# Patient Record
Sex: Male | Born: 1990 | Race: White | Hispanic: No | Marital: Single | Smoking: Current every day smoker
Health system: Southern US, Community
[De-identification: ages and names within clinical notes are randomized; demographics above are authoritative.]

---
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
## 2011-11-07 ENCOUNTER — Encounter (HOSPITAL_COMMUNITY): Payer: Self-pay | Admitting: *Deleted

## 2011-11-07 ENCOUNTER — Emergency Department (HOSPITAL_COMMUNITY)
Admission: EM | Admit: 2011-11-07 | Discharge: 2011-11-07 | Disposition: A | Discharge status: Home / Self Care | Payer: Self-pay | Attending: Emergency Medicine | Admitting: Emergency Medicine

## 2011-11-07 DIAGNOSIS — H109 Unspecified conjunctivitis: Secondary | ICD-10-CM | POA: Insufficient documentation

## 2011-11-07 MED ORDER — FLUORESCEIN SODIUM 1 MG OP STRP
ORAL_STRIP | OPHTHALMIC | Status: AC
  Filled 2011-11-07: qty 1

## 2011-11-07 MED ORDER — BACITRACIN-POLYMYXIN B 500-10000 UNIT/GM OP OINT
TOPICAL_OINTMENT | Freq: Once | OPHTHALMIC | Status: AC
  Administered 2011-11-07: 20:00:00 via OPHTHALMIC
  Filled 2011-11-07: qty 3.5

## 2011-11-07 MED ORDER — ERYTHROMYCIN 5 MG/GM OP OINT: TOPICAL_OINTMENT | Freq: Once | OPHTHALMIC | Status: DC

## 2011-11-07 NOTE — ED Provider Notes (Signed)
History     CSN: [id number]  Arrival date & time 11/07/11  1907   First MD Initiated Contact with Patient 11/07/11 1932      Chief Complaint  Patient presents with  . Eye Pain    (Consider location/radiation/quality/duration/timing/severity/associated sxs/prior treatment) HPI Comments: Redness and itching in left eye x 2 days.  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  Crusting in the am.  No difficulty with vision.  Otherwise doing well.  Patient is a 21 y.o. male presenting with eye pain. The history is provided by the patient.  Eye Pain This is a new problem. Episode onset: 2 days ago. The problem occurs constantly. The problem has been unchanged. Pertinent negatives include no arthralgias, chest pain, congestion, fatigue, fever, headaches, rash, sore throat or swollen glands. The symptoms are aggravated by nothing. He has tried nothing for the symptoms.    History reviewed. No pertinent past medical history.  History reviewed. No pertinent past surgical history.  History reviewed. No pertinent family history.  History  Substance Use Topics  . Smoking status: Never Smoker   . Smokeless tobacco: Not on file  . Alcohol Use: Yes      Review of Systems  Constitutional: Negative for fever, activity change and fatigue.  HENT: Negative for congestion and sore throat.   Eyes: Positive for pain.  Respiratory: Negative for chest tightness, shortness of breath, wheezing and stridor.   Cardiovascular: Negative for chest pain and leg swelling.  Genitourinary: Negative for dysuria.  Musculoskeletal: Negative for arthralgias.  Skin: Negative for rash.  Neurological: Negative for headaches.  Psychiatric/Behavioral: Negative for behavioral problems.    Allergies  Zithromax  Home Medications  No current outpatient prescriptions on file.  BP 111/74  Pulse 74  Temp(Src) 98.9 F (37.2 C) (Oral)  Resp 18  SpO2 98%  Physical Exam  Constitutional: He  is oriented to person, place, and time. He appears well-developed and well-nourished. No distress.  HENT:  Head: Normocephalic and atraumatic.  Eyes: EOM are normal. Pupils are equal, round, and reactive to light. Left eye exhibits discharge. No scleral icterus.       No abrasion on woods lamp exam.  Injection of conjunctiva.  Neck: Normal range of motion. Neck supple.  Pulmonary/Chest: Effort normal. No respiratory distress.  Abdominal: Soft. He exhibits no distension and no mass. There is no tenderness. There is no rebound and no guarding.  Musculoskeletal: Normal range of motion. He exhibits no edema and no tenderness.  Neurological: He is alert and oriented to person, place, and time. He has normal reflexes. No cranial nerve deficit. He exhibits normal muscle tone. Coordination normal.  Skin: Skin is warm and dry. No rash noted. He is not diaphoretic. No erythema.  Psychiatric: He has a normal mood and affect. His behavior is normal. Judgment and thought content normal.    ED Course  Procedures (including critical care time)  Labs Reviewed - No data to display No results found.   1. Conjunctivitis       MDM  Redness and itching in left eye x 2 days.  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  Crusting in the am.  No difficulty with vision.  Otherwise doing well.  No abrasion on woods lamp exam.  Treating with polytrim for conjunctivitis.  PCP f/u.  Pt comfortable with plan and will follow up.         Army Chaco, MD 11/07/11 2010

## 2011-11-07 NOTE — Discharge Instructions (Signed)
Conjunctivitis Conjunctivitis is commonly called "pink eye." Conjunctivitis can be caused by bacterial or viral infection, allergies, or injuries. There is usually redness of the lining of the eye, itching, discomfort, and sometimes discharge. There may be deposits of matter along the eyelids. A viral infection usually causes a watery discharge, while a bacterial infection causes a yellowish, thick discharge. Pink eye is very contagious and spreads by direct contact. You may be given antibiotic eyedrops as part of your treatment. Before using your eye medicine, remove all drainage from the eye by washing gently with warm water and cotton balls. Continue to use the medication until you have awakened 2 mornings in a row without discharge from the eye. Do not rub your eye. This increases the irritation and helps spread infection. Use separate towels from other household members. Wash your hands with soap and water before and after touching your eyes. Use cold compresses to reduce pain and sunglasses to relieve irritation from light. Do not wear contact lenses or wear eye makeup until the infection is gone. SEEK MEDICAL CARE IF:   Your symptoms are not better after 3 days of treatment.   You have increased pain or trouble seeing.   The outer eyelids become very red or swollen.  Document Released: 07/18/2004 Document Revised: 05/30/2011 Document Reviewed: 06/10/2005 [employer] Patient Information 2012 [employer]. 

## 2011-11-07 NOTE — ED Notes (Signed)
MD at bedside. 

## 2011-11-07 NOTE — ED Provider Notes (Signed)
21 year old male has had itching and redness of his left eye for the last 3 days. The eyelids have been matted shut in the morning. He denies pain. He was worried that he might discuss according to [employer] has been doing Holiday representative work. Exam is typical of conjunctivitis with erythema the conjunctiva with limbal sparing. Anterior chamber is clear. He is advised to use Naphcon-A for itching and reassured of benign nature of his condition.  Dione Booze, MD 11/07/11 [phone]

## 2011-11-07 NOTE — ED Notes (Signed)
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

## 2011-11-07 NOTE — ED Provider Notes (Signed)
I saw and evaluated the patient, reviewed the resident's note and I agree with the findings and plan.   Dione Booze, MD 11/07/11 [phone]

## 2012-06-12 ENCOUNTER — Encounter (HOSPITAL_COMMUNITY): Payer: Self-pay | Admitting: *Deleted

## 2012-06-12 ENCOUNTER — Emergency Department (INDEPENDENT_AMBULATORY_CARE_PROVIDER_SITE_OTHER)
Admission: EM | Admit: 2012-06-12 | Discharge: 2012-06-12 | Disposition: A | Discharge status: Home / Self Care | Payer: Self-pay | Source: Home / Self Care | Attending: Emergency Medicine | Admitting: Emergency Medicine

## 2012-06-12 DIAGNOSIS — B009 Herpesviral infection, unspecified: Secondary | ICD-10-CM

## 2012-06-12 DIAGNOSIS — B001 Herpesviral vesicular dermatitis: Secondary | ICD-10-CM

## 2012-06-12 LAB — POCT RAPID STREP A: Streptococcus, Group A Screen (Direct): NEGATIVE

## 2012-06-12 MED ORDER — PENICILLIN V POTASSIUM 500 MG PO TABS: 500.0000 mg | ORAL_TABLET | Freq: Four times a day (QID) | ORAL | Status: DC

## 2012-06-12 MED ORDER — MAGIC MOUTHWASH W/LIDOCAINE: 5.0000 mL | Freq: Four times a day (QID) | ORAL | Status: DC

## 2012-06-12 MED ORDER — ACYCLOVIR 400 MG PO TABS: 400.0000 mg | ORAL_TABLET | ORAL | Status: DC

## 2012-06-12 MED ORDER — TRAMADOL HCL 50 MG PO TABS: 100.0000 mg | ORAL_TABLET | Freq: Three times a day (TID) | ORAL | Status: DC | PRN

## 2012-06-12 NOTE — ED Provider Notes (Signed)
Chief Complaint  Patient presents with  . Fever    History of Present Illness:   Joseph Chapman is a 21 year old male who has had a three-day history of fever up to 103, multiple blisters on his lips, swelling of the gingiva, and sore throat. His teeth into her shoulder and is painful for him to eat. He's had some nasal congestion and rhinorrhea and some loose stools. He denies any cough. He's not had a prior history of fever blisters or herpes on his lips are in his mouth. He's concerned because he was sexually active with a male sexual partner right before this began. The patient states he did not perform oral sex, his partner did not appear to have any lesions on her lips or in her mouth.  Review of Systems:  Other than noted above, the patient denies any of the following symptoms: Systemic:  No fevers, chills, sweats, weight loss or gain, fatigue, or tiredness. Eye:  No redness, pain, discharge, itching, blurred vision, or diplopia. ENT:  No headache, nasal congestion, sneezing, itching, epistaxis, ear pain, congestion, decreased hearing, ringing in ears, vertigo, or tinnitus.  No oral lesions, sore throat, pain on swallowing, or hoarseness. Neck:  No mass, tenderness or adenopathy. Lungs:  No coughing, wheezing, or shortness of breath. Skin:  No rash or itching.  PMFSH:  Past medical history, family history, social history, meds, and allergies were reviewed.  Physical Exam:   Vital signs:  BP 122/80  Pulse 78  Temp 100.2 F (37.9 C) (Oral)  Resp 16  SpO2 100% General:  Alert and oriented.  In no distress.  Skin warm and dry. Eye:  PERRL, full EOMs, lids and conjunctiva normal.   ENT:  TMs and canals clear.  Nasal mucosa not congested and without drainage.  Mucous membranes moist, he has multiple, pustular blisters on his upper and lower lips. His gingiva is swollen, red, and tender to touch. There no intraoral lesions. His tonsils are enlarged and red and there are a few spots of white  exudate.  No cranial or facial pain to palplation. Neck:  Supple, full ROM.  No adenopathy, tenderness or mass.  Thyroid normal. Lungs:  Breath sounds clear and equal bilaterally.  No wheezes, rales or rhonchi. Heart:  Rhythm regular, without extrasystoles.  No gallops or murmers. Skin:  Clear, warm and dry.  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  Plan:   1.  The following meds were prescribed:   New Prescriptions   ACYCLOVIR (ZOVIRAX) 400 MG TABLET    Take 1 tablet (400 mg total) by mouth every 4 (four) hours while awake.   ALUM & MAG HYDROXIDE-SIMETH (MAGIC MOUTHWASH W/LIDOCAINE) SOLN    Take 5 mLs by mouth 4 (four) times daily.   PENICILLIN V POTASSIUM (VEETID) 500 MG TABLET    Take 1 tablet (500 mg total) by mouth 4 (four) times daily.   TRAMADOL (ULTRAM) 50 MG TABLET    Take 2 tablets (100 mg total) by mouth every 8 (eight) hours as needed for pain.   2.  The patient was instructed in symptomatic care and handouts were given. 3.  The patient was told to return if becoming worse in any way, if no better  in 3 or 4 days, and given some red flag symptoms that would indicate earlier return.  Follow up:  The patient was told to follow up here if no better in 3-4 days.       Reuben Likes, MD 06/12/12 [phone]

## 2012-06-12 NOTE — ED Notes (Signed)
Pt  Reports  Symptoms  Of  Fever          Blisters     On  Mouth              X  2  Days     Pt   Reports    He  Ho  Genitalia  Symptoms              He  Reports  He  Has  Tried  otc  meds   For the  Sores

## 2013-03-07 ENCOUNTER — Emergency Department (HOSPITAL_COMMUNITY)
Admission: EM | Admit: 2013-03-07 | Discharge: 2013-03-07 | Disposition: A | Discharge status: Home / Self Care | Payer: Self-pay | Attending: Emergency Medicine | Admitting: Emergency Medicine

## 2013-03-07 ENCOUNTER — Encounter (HOSPITAL_COMMUNITY): Payer: Self-pay | Admitting: Nurse Practitioner

## 2013-03-07 DIAGNOSIS — IMO0002 Reserved for concepts with insufficient information to code with codable children: Secondary | ICD-10-CM | POA: Insufficient documentation

## 2013-03-07 DIAGNOSIS — Z792 Long term (current) use of antibiotics: Secondary | ICD-10-CM | POA: Insufficient documentation

## 2013-03-07 DIAGNOSIS — Z79899 Other long term (current) drug therapy: Secondary | ICD-10-CM | POA: Insufficient documentation

## 2013-03-07 DIAGNOSIS — L0291 Cutaneous abscess, unspecified: Secondary | ICD-10-CM

## 2013-03-07 MED ORDER — CEPHALEXIN 500 MG PO CAPS: 500.0000 mg | ORAL_CAPSULE | Freq: Four times a day (QID) | ORAL | Status: DC

## 2013-03-07 NOTE — ED Notes (Signed)
Pt with 2 red painful spots to R elbow x 2 days. One of the bumps had white area so he popped it and drainage came out but it continues to get more painful and swollen. Took aspirin with some relief

## 2013-03-07 NOTE — ED Provider Notes (Signed)
CSN: [id number]     Arrival date & time 03/07/13  1447 History  This chart was scribed for non-physician practitioner Junious Silk, PA-C, working with Audree Camel, MD by Dorothey Baseman, ED Scribe. This patient was seen in room TR09C/TR09C and the patient's care was started at 3:35 PM.    Chief Complaint  Patient presents with  . Wound Infection   The history is provided by the patient. No language interpreter was used.   HPI Comments: Joseph Chapman is a 22 y.o. male who presents to the Emergency Department complaining of an abscess to his right elbow onset 1-2 days ago with an associated burning pain, swelling, warmth, and drainage. He states that he may have been bit by a spider at his job, but is unsure. He states that he applied A&D ointment to the area and has taken Aspirin at home with mild, temporary relief. He states these symptoms are new for him. No prior history of abscess. He denies fever, nausea, vomiting, headaches, or any other symptoms at this time.   History reviewed. No pertinent past medical history. History reviewed. No pertinent past surgical history. History reviewed. No pertinent family history. History  Substance Use Topics  . Smoking status: Never Smoker   . Smokeless tobacco: Not on file  . Alcohol Use: Yes    Review of Systems  A complete 10 system review of systems was obtained and all systems are negative except as noted in the HPI and PMH.   Allergies  Zithromax  Home Medications   Current Outpatient Rx  Name  Route  Sig  Dispense  Refill  . acyclovir (ZOVIRAX) 400 MG tablet   Oral   Take 1 tablet (400 mg total) by mouth every 4 (four) hours while awake.   50 tablet   0   . Alum & Mag Hydroxide-Simeth (MAGIC MOUTHWASH W/LIDOCAINE) SOLN   Oral   Take 5 mLs by mouth 4 (four) times daily.   240 mL   0   . penicillin v potassium (VEETID) 500 MG tablet   Oral   Take 1 tablet (500 mg total) by mouth 4 (four) times daily.   40 tablet   0    . traMADol (ULTRAM) 50 MG tablet   Oral   Take 2 tablets (100 mg total) by mouth every 8 (eight) hours as needed for pain.   30 tablet   0    Triage Vitals: BP 149/73  Pulse 82  Temp(Src) 97.8 F (36.6 C) (Oral)  Resp 16  SpO2 99%  Physical Exam  Nursing note and vitals reviewed. Constitutional: He is oriented to person, place, and time. He appears well-developed and well-nourished. No distress.  HENT:  Head: Normocephalic and atraumatic.  Right Ear: External ear normal.  Left Ear: External ear normal.  Nose: Nose normal.  Eyes: Conjunctivae are normal.  Neck: Normal range of motion. Neck supple. No tracheal deviation present.  Cardiovascular: Normal rate, regular rhythm and normal heart sounds.   Pulmonary/Chest: Effort normal and breath sounds normal. No stridor.  Abdominal: Soft. He exhibits no distension. There is no tenderness.  Musculoskeletal: Normal range of motion.  Neurological: He is alert and oriented to person, place, and time.  Skin: Skin is warm and dry. He is not diaphoretic.  3cm area of erythema and induration with a small pustule, just superior to the right elbow. 2cm area of induration just inferior to the right elbow.  Psychiatric: He has a normal mood and affect.  His behavior is normal.    ED Course  Procedures (including critical care time)  DIAGNOSTIC STUDIES: Oxygen Saturation is 99% on room air, normal by my interpretation.    COORDINATION OF CARE: 3:38PM- Will perform an incision and drainage of the area. Discussed treatment plan with patient at bedside and patient verbalized agreement.   INCISION AND DRAINAGE Performed by: Junious Silk Consent: Verbal consent obtained. Risks and benefits: risks, benefits and alternatives were discussed Type: abscess  Body area: right distal upper arm  Anesthesia: local infiltration  Incision was made with a scalpel.  Local anesthetic: lidocaine 2% 1% epinephrine  Anesthetic total: 3  ml  Complexity: complex Blunt dissection to break up loculations  Drainage: purulent  Drainage amount: mild  Patient tolerance: Patient tolerated the procedure well with no immediate complications.  INCISION AND DRAINAGE Performed by: Junious Silk Consent: Verbal consent obtained. Risks and benefits: risks, benefits and alternatives were discussed Type: abscess  Body area: right proximal lower arm  Anesthesia: local infiltration  Incision was made with a scalpel.  Local anesthetic: lidocaine 2% 1% epinephrine  Anesthetic total: 3 ml  Complexity: complex Blunt dissection to break up loculations  Drainage: purulent  Drainage amount: mild  Patient tolerance: Patient tolerated the procedure well with no immediate complications.        Labs Review Labs Reviewed - No data to display Imaging Review No results found.  MDM   1. Abscess    Patient with skin abscess amenable to incision and drainage.  Abscess was not large enough to warrant packing or drain,  wound recheck in 2 days. Encouraged home warm soaks and flushing.  Signs of cellulitis is surrounding skin.  Will d/c to home. Covered with Keflex.   I personally performed the services described in this documentation, which was scribed in my presence. The recorded information has been reviewed and is accurate.     Mora Bellman, PA-C 03/07/13 [phone]

## 2013-03-08 NOTE — ED Provider Notes (Signed)
Medical screening examination/treatment/procedure(s) were performed by non-physician practitioner and as supervising physician I was immediately available for consultation/collaboration.   Audree Camel, MD 03/08/13 1224

## 2013-10-13 ENCOUNTER — Emergency Department (HOSPITAL_COMMUNITY)
Admission: EM | Admit: 2013-10-13 | Discharge: 2013-10-13 | Disposition: A | Discharge status: Home / Self Care | Payer: Self-pay | Attending: Emergency Medicine | Admitting: Emergency Medicine

## 2013-10-13 ENCOUNTER — Encounter (HOSPITAL_COMMUNITY): Payer: Self-pay | Admitting: Emergency Medicine

## 2013-10-13 DIAGNOSIS — H00013 Hordeolum externum right eye, unspecified eyelid: Secondary | ICD-10-CM

## 2013-10-13 DIAGNOSIS — Z7982 Long term (current) use of aspirin: Secondary | ICD-10-CM | POA: Insufficient documentation

## 2013-10-13 DIAGNOSIS — H00019 Hordeolum externum unspecified eye, unspecified eyelid: Secondary | ICD-10-CM | POA: Insufficient documentation

## 2013-10-13 MED ORDER — BACITRACIN-POLYMYXIN B 500-10000 UNIT/GM OP OINT
1.0000 | TOPICAL_OINTMENT | Freq: Two times a day (BID) | OPHTHALMIC | Status: DC
Start: 2013-10-13 — End: 2015-08-18

## 2013-10-13 NOTE — Discharge Instructions (Signed)
Use antibiotic ointment until symptoms resolve. Refer to attached documents for more information.

## 2013-10-13 NOTE — ED Notes (Addendum)
Pt c/o painful, swelling lesions to right eyelid x 1 week. Pt very anxious.

## 2013-10-13 NOTE — ED Provider Notes (Signed)
CSN: [id number]     Arrival date & time 10/13/13  1235 History  This chart was scribed for non-physician practitioner working with Emilia BeckKaitlyn Fernando Stoiber , by Tana ConchStephen Methvin ED Scribe. This patient was seen in TR04C/TR04C and the patient's care was started at 2:23 PM.    Chief Complaint  Patient presents with  . Eye Problem      Patient is a 23 y.o. male presenting with eye problem. The history is provided by the patient. No language interpreter was used.  Eye Problem Location:  R eye Quality:  Unable to specify Severity:  Moderate Onset quality:  Gradual Duration:  1 week Timing:  Sporadic Progression:  Worsening Chronicity:  Recurrent Relieved by:  Nothing Worsened by:  Nothing tried Ineffective treatments:  None tried Associated symptoms: blurred vision and redness     HPI Comments: Asencion PartridgeLawrence J Dicenso is a 23 y.o. male who presents to the Emergency Department complaining of reccurent right eye pain, he says "there are several sores" He has been putting seeing a physician off as he cannot afford one currently. He also states that his vision in that eye is blurry.   History reviewed. No pertinent past medical history. History reviewed. No pertinent past surgical history. No family history on file. History  Substance Use Topics  . Smoking status: Never Smoker   . Smokeless tobacco: Not on file  . Alcohol Use: No    Review of Systems  Eyes: Positive for blurred vision, pain and redness.       Swelling around right eye   All other systems reviewed and are negative.     Allergies  Zithromax  Home Medications   Prior to Admission medications   Medication Sig Start Date End Date Taking? Authorizing Provider  acetaminophen (TYLENOL) 325 MG tablet Take 650 mg by mouth every 6 (six) hours as needed for mild pain, moderate pain or fever.   Yes Historical Provider, MD  aspirin 325 MG tablet Take 325 mg by mouth every 4 (four) hours as needed.   Yes Historical Provider, MD    BP 148/97  Pulse 70  Temp(Src) 98.1 F (36.7 C) (Oral)  SpO2 100% Physical Exam  Nursing note and vitals reviewed. Constitutional: He is oriented to person, place, and time. He appears well-developed and well-nourished.  HENT:  Head: Normocephalic.  Eyes: EOM are normal.  Right eyelid  Upper inner eyelid tender, palpable mass. No drainage. Erythema to affected area.  Neck: Normal range of motion.  Pulmonary/Chest: Effort normal.  Abdominal: He exhibits no distension.  Musculoskeletal: Normal range of motion.  Neurological: He is alert and oriented to person, place, and time.  Psychiatric: He has a normal mood and affect.    ED Course  Procedures (including critical care time)  DIAGNOSTIC STUDIES: Oxygen Saturation is 100% on RA, normal by my interpretation.    COORDINATION OF CARE:   2:25 PM-Discussed treatment plan which includes polysporin with pt at bedside and pt agreed to plan.   Labs Review Labs Reviewed - No data to display  Imaging Review No results found.   EKG Interpretation None      MDM   Final diagnoses:  Hordeolum externum of right eye   I personally performed the services described in this documentation, which was scribed in my presence. The recorded information has been reviewed and is accurate.   2:45 PM Patient has a hordeolum of his right eye. Patient will be discharged with polysporin ointment.     Emilia BeckKaitlyn Gates Jividen,  PA-C 10/13/13 1446

## 2013-10-13 NOTE — ED Provider Notes (Signed)
Medical screening examination/treatment/procedure(s) were performed by non-physician practitioner and as supervising physician I was immediately available for consultation/collaboration.   EKG Interpretation None        Dior Stepter M Tonantzin Mimnaugh, DO 10/13/13 1918 

## 2014-10-23 ENCOUNTER — Emergency Department (HOSPITAL_COMMUNITY): Payer: 59

## 2014-10-23 ENCOUNTER — Encounter (HOSPITAL_COMMUNITY): Payer: Self-pay | Admitting: Emergency Medicine

## 2014-10-23 ENCOUNTER — Emergency Department (HOSPITAL_COMMUNITY)
Admission: EM | Admit: 2014-10-23 | Discharge: 2014-10-24 | Disposition: A | Discharge status: Home / Self Care | Payer: 59 | Attending: Emergency Medicine | Admitting: Emergency Medicine

## 2014-10-23 DIAGNOSIS — Y929 Unspecified place or not applicable: Secondary | ICD-10-CM | POA: Insufficient documentation

## 2014-10-23 DIAGNOSIS — R1110 Vomiting, unspecified: Secondary | ICD-10-CM | POA: Diagnosis not present

## 2014-10-23 DIAGNOSIS — R109 Unspecified abdominal pain: Secondary | ICD-10-CM | POA: Diagnosis not present

## 2014-10-23 DIAGNOSIS — Z7982 Long term (current) use of aspirin: Secondary | ICD-10-CM | POA: Diagnosis not present

## 2014-10-23 DIAGNOSIS — L03113 Cellulitis of right upper limb: Secondary | ICD-10-CM | POA: Insufficient documentation

## 2014-10-23 DIAGNOSIS — Z87891 Personal history of nicotine dependence: Secondary | ICD-10-CM | POA: Diagnosis not present

## 2014-10-23 DIAGNOSIS — Y939 Activity, unspecified: Secondary | ICD-10-CM | POA: Diagnosis not present

## 2014-10-23 DIAGNOSIS — R51 Headache: Secondary | ICD-10-CM | POA: Diagnosis not present

## 2014-10-23 DIAGNOSIS — W57XXXA Bitten or stung by nonvenomous insect and other nonvenomous arthropods, initial encounter: Secondary | ICD-10-CM | POA: Insufficient documentation

## 2014-10-23 DIAGNOSIS — Y999 Unspecified external cause status: Secondary | ICD-10-CM | POA: Diagnosis not present

## 2014-10-23 DIAGNOSIS — S60561A Insect bite (nonvenomous) of right hand, initial encounter: Secondary | ICD-10-CM | POA: Diagnosis present

## 2014-10-23 LAB — CBC WITH DIFFERENTIAL/PLATELET
BASOS ABS: 0 10*3/uL (ref 0.0–0.1)
BASOS PCT: 0 % (ref 0–1)
Eosinophils Absolute: 0.2 10*3/uL (ref 0.0–0.7)
Eosinophils Relative: 1 % (ref 0–5)
HEMATOCRIT: 41.6 % (ref 39.0–52.0)
HEMOGLOBIN: 14.6 g/dL (ref 13.0–17.0)
LYMPHS PCT: 22 % (ref 12–46)
Lymphs Abs: 2.8 10*3/uL (ref 0.7–4.0)
MCH: 30.7 pg (ref 26.0–34.0)
MCHC: 35.1 g/dL (ref 30.0–36.0)
MCV: 87.6 fL (ref 78.0–100.0)
Monocytes Absolute: 0.8 10*3/uL (ref 0.1–1.0)
Monocytes Relative: 6 % (ref 3–12)
NEUTROS ABS: 9.2 10*3/uL — AB (ref 1.7–7.7)
NEUTROS PCT: 71 % (ref 43–77)
Platelets: 209 10*3/uL (ref 150–400)
RBC: 4.75 MIL/uL (ref 4.22–5.81)
RDW: 12.3 % (ref 11.5–15.5)
WBC: 13 10*3/uL — AB (ref 4.0–10.5)

## 2014-10-23 LAB — I-STAT CHEM 8, ED
BUN: 9 mg/dL (ref 6–20)
CALCIUM ION: 1.14 mmol/L (ref 1.12–1.23)
CHLORIDE: 99 mmol/L — AB (ref 101–111)
Creatinine, Ser: 0.9 mg/dL (ref 0.61–1.24)
GLUCOSE: 94 mg/dL (ref 70–99)
HCT: 45 % (ref 39.0–52.0)
Hemoglobin: 15.3 g/dL (ref 13.0–17.0)
Potassium: 3.6 mmol/L (ref 3.5–5.1)
Sodium: 139 mmol/L (ref 135–145)
TCO2: 23 mmol/L (ref 0–100)

## 2014-10-23 MED ORDER — OXYCODONE-ACETAMINOPHEN 5-325 MG PO TABS
2.0000 | ORAL_TABLET | Freq: Once | ORAL | Status: AC
  Administered 2014-10-23: 2 via ORAL
  Filled 2014-10-23: qty 2

## 2014-10-23 MED ORDER — DOXYCYCLINE HYCLATE 100 MG PO TABS
100.0000 mg | ORAL_TABLET | Freq: Once | ORAL | Status: AC
  Administered 2014-10-23: 100 mg via ORAL
  Filled 2014-10-23: qty 1

## 2014-10-23 NOTE — ED Provider Notes (Signed)
CSN: [id number]     Arrival date & time 10/23/14  2135 History  This chart was scribed for Zadie Rhine, MD by Annye Asa, ED Scribe. This patient was seen in room D30C/D30C and the patient's care was started at 11:08 PM.   Patient gave verbal permission to utilize photo for medical documentation only The image was not stored on any personal device   Chief Complaint  Patient presents with  . Insect Bite   Patient is a 24 y.o. male presenting with hand pain. The history is provided by the patient. No language interpreter was used.  Hand Pain This is a new problem. The current episode started 2 days ago. The problem occurs constantly. The problem has been gradually worsening. Associated symptoms include abdominal pain and headaches. Exacerbated by: Movement of the hand, applied pressure. Nothing relieves the symptoms. Treatments tried: ibuprofen. The treatment provided no relief.    HPI Comments: Joseph Chapman is an otherwise healthy 24 y.o. male who presents to the Emergency Department complaining of 2 days of gradually worsening right hand pain after what the patient believes is an insect bite. Patient reports swelling and redness to the area, abdominal pain, vomiting (1x), generalized myalgias, headache. He explains he has difficulty opening and closing his right hand due to pain. Patient states he has taken ibuprofen without relief. He denies known injury or trauma. He denies fever, cough, known tick exposure.   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   PMH - none  History  Substance Use Topics  . Smoking status: Former Games developer  . Smokeless tobacco: Not on file  . Alcohol Use: No    Review of Systems  Constitutional: Negative for fever.  Respiratory: Negative for cough.   Gastrointestinal: Positive for vomiting and abdominal pain.  Musculoskeletal: Positive for myalgias.  Skin:       Redness and swelling to the right hand    Neurological: Positive for headaches.  All other systems reviewed and are negative.   Allergies  Zithromax  Home Medications   Prior to Admission medications   Medication Sig Start Date End Date Taking? Authorizing Provider  acetaminophen (TYLENOL) 325 MG tablet Take 650 mg by mouth every 6 (six) hours as needed for mild pain, moderate pain or fever.    Historical Provider, MD  aspirin 325 MG tablet Take 325 mg by mouth every 4 (four) hours as needed.    Historical Provider, MD  bacitracin-polymyxin b (POLYSPORIN) ophthalmic ointment Place 1 application into the right eye every 12 (twelve) hours. apply to eye every 12 hours while awake 10/13/13   Kaitlyn Szekalski, PA-C   BP 136/95 mmHg  Pulse 80  Temp(Src) 98.1 F (36.7 C) (Oral)  Resp 18  Ht  (1.626 m)  Wt 154 lb 1.6 oz (69.899 kg)  BMI 26.44 kg/m2  SpO2 100% Physical Exam  Nursing note and vitals reviewed.   CONSTITUTIONAL: Well developed/well nourished HEAD: Normocephalic/atraumatic EYES: EOMI ENMT: Mucous membranes moist NECK: supple no meningeal signs SPINE/BACK:entire spine nontender CV: S1/S2 noted, no murmurs/rubs/gallops noted LUNGS: Lungs are clear to auscultation bilaterally, no apparent distress ABDOMEN: soft, nontender, no rebound or guarding, bowel sounds noted throughout abdomen NEURO: Pt is awake/alert/appropriate, moves all extremitiesx4.  No facial droop.   EXTREMITIES: pulses normal/equal, full ROM SKIN: warm, color normal PSYCH: no abnormalities of mood noted, alert and oriented to situation    ED Course  Procedures   DIAGNOSTIC STUDIES: Oxygen  Saturation is 100% on RA, normal by my interpretation.    COORDINATION OF CARE: 11:14 PM Discussed treatment plan with pt at bedside and pt agreed to plan.  No signs of trauma No fluctuance/crepitus noted Will treat as cellulitis Discussed strict return precautions He was advised to f/u in 48 HRS for recheck   Medications   oxyCODONE-acetaminophen (PERCOCET/ROXICET) 5-325 MG per tablet 2 tablet (2 tablets Oral Given 10/23/14 2317)  doxycycline (VIBRA-TABS) tablet 100 mg (100 mg Oral Given 10/23/14 2317)     Labs Review Labs Reviewed  CBC WITH DIFFERENTIAL/PLATELET - Abnormal; Notable for the following:    WBC 13.0 (*)    Neutro Abs 9.2 (*)    All other components within normal limits  I-STAT CHEM 8, ED - Abnormal; Notable for the following:    Chloride 99 (*)    All other components within normal limits    Imaging Review Dg Hand Complete Right  10/23/2014   CLINICAL DATA:  Spider bite the right hand, fifth metacarpal  EXAM: RIGHT HAND - COMPLETE 3+ VIEW  COMPARISON:  None.  FINDINGS: No evidence of fracture of the carpal or metacarpal bones. Radiocarpal joint is intact. Phalanges are normal. No soft tissue injury. No foreign body  IMPRESSION: No fracture or foreign body.   Electronically Signed   By: Genevive BiStewart  Edmunds M.D.   On: 10/23/2014 23:56     MDM   Final diagnoses:  None    Nursing notes including past medical history and social history reviewed and considered in documentation xrays/imaging reviewed by myself and considered during evaluation Labs/vital reviewed myself and considered during evaluation    I personally performed the services described in this documentation, which was scribed in my presence. The recorded information has been reviewed and is accurate.      Zadie Rhineonald Kurk Corniel, MD 10/24/14 Moses [email or web address]

## 2014-10-23 NOTE — ED Notes (Addendum)
Pt c/o possible spider bite to right hand.  Right hand swollen and red. Pt c/o aching all over and headache.  Also has vomited x's 1 today

## 2014-10-23 NOTE — ED Notes (Signed)
MD at bedside. 

## 2014-10-23 NOTE — ED Notes (Signed)
Taken to xray at this time. 

## 2014-10-24 MED ORDER — DOXYCYCLINE HYCLATE 100 MG PO CAPS: 100.0000 mg | ORAL_CAPSULE | Freq: Two times a day (BID) | ORAL | Status: DC

## 2014-10-24 MED ORDER — IBUPROFEN 600 MG PO TABS: 600.0000 mg | ORAL_TABLET | Freq: Four times a day (QID) | ORAL | Status: DC | PRN

## 2014-10-24 NOTE — Discharge Instructions (Signed)

## 2015-03-03 ENCOUNTER — Encounter (HOSPITAL_COMMUNITY): Payer: Self-pay | Admitting: Emergency Medicine

## 2015-03-03 ENCOUNTER — Emergency Department (HOSPITAL_COMMUNITY)
Admission: EM | Admit: 2015-03-03 | Discharge: 2015-03-04 | Disposition: A | Discharge status: Home / Self Care | Payer: 59 | Attending: Emergency Medicine | Admitting: Emergency Medicine

## 2015-03-03 DIAGNOSIS — Z23 Encounter for immunization: Secondary | ICD-10-CM | POA: Insufficient documentation

## 2015-03-03 DIAGNOSIS — R110 Nausea: Secondary | ICD-10-CM | POA: Insufficient documentation

## 2015-03-03 DIAGNOSIS — Z87891 Personal history of nicotine dependence: Secondary | ICD-10-CM | POA: Insufficient documentation

## 2015-03-03 DIAGNOSIS — L03113 Cellulitis of right upper limb: Secondary | ICD-10-CM

## 2015-03-03 DIAGNOSIS — L02511 Cutaneous abscess of right hand: Secondary | ICD-10-CM

## 2015-03-03 DIAGNOSIS — Z7982 Long term (current) use of aspirin: Secondary | ICD-10-CM | POA: Insufficient documentation

## 2015-03-03 MED ORDER — OXYCODONE-ACETAMINOPHEN 5-325 MG PO TABS
1.0000 | ORAL_TABLET | Freq: Once | ORAL | Status: AC
  Administered 2015-03-03: 1 via ORAL
  Filled 2015-03-03: qty 1

## 2015-03-03 NOTE — ED Provider Notes (Signed)
CSN: [id number]     Arrival date & time 03/03/15  2244 History  This chart was scribed for non-physician practitioner, Trixie Dredge, PA working with Derwood Kaplan, MD by Gwenyth Ober, ED scribe. This patient was seen in room TR06C/TR06C and the patient's care was started at 11:33 PM   Chief Complaint  Patient presents with  . Abscess   The history is provided by the patient. No language interpreter was used.   HPI Comments: Joseph Chapman is a 24 y.o. male who presents to the Emergency Department complaining of constant, moderate, aching right hand pain that started 1 week ago. Pt states nausea as an associated symptom. His pain becomes worse with movement and palpation of his hand. He has tried topical antibiotic ointment and peroxide with no relief. Pt reports that onset of pain started after he was sleeping and he felt something bite his hand. He did not see any animals. Pt works as a Nutritional therapist. He denies fever, chills and vomiting.   History reviewed. No pertinent past medical history. History reviewed. No pertinent past surgical history. No family history on file. Social History  Substance Use Topics  . Smoking status: Former Games developer  . Smokeless tobacco: None  . Alcohol Use: No    Review of Systems  Constitutional: Negative for fever and chills.  Respiratory: Negative for shortness of breath.   Gastrointestinal: Positive for nausea. Negative for vomiting and abdominal pain.  Musculoskeletal: Positive for joint swelling and arthralgias.  Skin: Positive for color change and wound.  Allergic/Immunologic: Negative for immunocompromised state.  Hematological: Does not bruise/bleed easily.  Psychiatric/Behavioral: Negative for self-injury.      Allergies  Zithromax  Home Medications   Prior to Admission medications   Medication Sig Start Date End Date Taking? Authorizing Provider  acetaminophen (TYLENOL) 325 MG tablet Take 650 mg by mouth every 6 (six) hours as needed for  mild pain, moderate pain or fever.    Historical Provider, MD  aspirin 325 MG tablet Take 325 mg by mouth every 4 (four) hours as needed.    Historical Provider, MD  bacitracin-polymyxin b (POLYSPORIN) ophthalmic ointment Place 1 application into the right eye every 12 (twelve) hours. apply to eye every 12 hours while awake 10/13/13   Emilia Beck, PA-C  doxycycline (VIBRAMYCIN) 100 MG capsule Take 1 capsule (100 mg total) by mouth 2 (two) times daily. One po bid x 7 days 10/24/14   Zadie Rhine, MD  ibuprofen (ADVIL,MOTRIN) 600 MG tablet Take 1 tablet (600 mg total) by mouth every 6 (six) hours as needed. 10/24/14   Zadie Rhine, MD   BP 130/85 mmHg  Pulse 89  Temp(Src) 98.5 F (36.9 C) (Oral)  Resp 16  SpO2 100% Physical Exam  Constitutional: He appears well-developed and well-nourished. No distress.  HENT:  Head: Normocephalic and atraumatic.  Neck: Neck supple.  Pulmonary/Chest: Effort normal.  Musculoskeletal:  Large area of erythema and edema over the dorsal aspect of the 5th proximal phalanx Scabby pustular head overlying  Erythematous streaking over the dorsal hand Pt has full flexion, but decreased extension due to edema and pain  Neurological: He is alert.  Skin: He is not diaphoretic.  Nursing note and vitals reviewed.      ED Course  Procedures   DIAGNOSTIC STUDIES: Oxygen Saturation is 100% on RA, normal by my interpretation.    COORDINATION OF CARE: 11:41 PM Discussed treatment plan with pt at bedside and pt agreed to plan.   Labs Review Labs  Reviewed  CULTURE, ROUTINE-ABSCESS    Imaging Review No results found.   EKG Interpretation None       12:13 AM Discussed pt with Dr Rhunette Croft who has also seen and examined the patient.  He recommends called Dr Izora Ribas (Hand surgery) for further recommendations.    12:44 AM Discussed pt with Dr Izora Ribas who would like for me to I&D abscess in ED, pack it, and he will see pt in follow up in the office.     INCISION AND DRAINAGE Performed by: Trixie Dredge Consent: Verbal consent obtained. Risks and benefits: risks, benefits and alternatives were discussed Type: abscess  Body area: right dorsal 5th finger  Anesthesia: local infiltration  Incision was made with a scalpel.  Local anesthetic: lidocaine 1% with epinephrine  Anesthetic total: 5 ml  Complexity: complex Blunt dissection to break up loculations  Drainage: purulent  Drainage amount: small  Packing material: none.  Attempted packing but incision and pocket too small.    Abscess irrigated with Normal Saline.   Patient tolerance: Patient tolerated the procedure well with no immediate complications.     MDM   Final diagnoses:  Abscess of finger of right hand  Cellulitis of right hand    Afebrile, nontoxic patient with abscess to dorsal 5th finger with streaking into dorsal hand.  See discussion.  Minimal I&D performed by me.  Abscess culture obtained.  Pt given first dose of bactrim and keflex in ED, after culture obtained.  D/C home with norco, keflex, bactrim, hand surgery follow up.   Discussed result, findings, treatment, and follow up  with patient.  Pt given return precautions.  Pt verbalizes understanding and agrees with plan.       I personally performed the services described in this documentation, which was scribed in my presence. The recorded information has been reviewed and is accurate.    Villa Pancho, PA-C 03/04/15 0133  Derwood Kaplan, MD 03/06/15 [phone]

## 2015-03-03 NOTE — ED Notes (Signed)
Pt. presents with abscess at right proximal 5th finger onset this week with no drainage .

## 2015-03-04 MED ORDER — SULFAMETHOXAZOLE-TRIMETHOPRIM 800-160 MG PO TABS
1.0000 | ORAL_TABLET | Freq: Once | ORAL | Status: AC
  Administered 2015-03-04: 1 via ORAL
  Filled 2015-03-04: qty 1

## 2015-03-04 MED ORDER — IBUPROFEN 400 MG PO TABS
800.0000 mg | ORAL_TABLET | Freq: Once | ORAL | Status: AC
  Administered 2015-03-04: 800 mg via ORAL
  Filled 2015-03-04: qty 4

## 2015-03-04 MED ORDER — IBUPROFEN 800 MG PO TABS: 800.0000 mg | ORAL_TABLET | Freq: Three times a day (TID) | ORAL | Status: DC | PRN

## 2015-03-04 MED ORDER — CEPHALEXIN 500 MG PO CAPS: 500.0000 mg | ORAL_CAPSULE | Freq: Four times a day (QID) | ORAL | Status: DC

## 2015-03-04 MED ORDER — HYDROCODONE-ACETAMINOPHEN 5-325 MG PO TABS
1.0000 | ORAL_TABLET | ORAL | Status: DC | PRN
Start: 2015-03-04 — End: 2015-08-18

## 2015-03-04 MED ORDER — LIDOCAINE HCL (PF) 1 % IJ SOLN
5.0000 mL | Freq: Once | INTRAMUSCULAR | Status: AC
  Administered 2015-03-04: 5 mL
  Filled 2015-03-04: qty 5

## 2015-03-04 MED ORDER — SULFAMETHOXAZOLE-TRIMETHOPRIM 800-160 MG PO TABS: 1.0000 | ORAL_TABLET | Freq: Two times a day (BID) | ORAL | Status: AC

## 2015-03-04 MED ORDER — CEPHALEXIN 250 MG PO CAPS
500.0000 mg | ORAL_CAPSULE | Freq: Once | ORAL | Status: AC
  Administered 2015-03-04: 500 mg via ORAL
  Filled 2015-03-04: qty 2

## 2015-03-04 MED ORDER — TETANUS-DIPHTH-ACELL PERTUSSIS 5-2.5-18.5 LF-MCG/0.5 IM SUSP
0.5000 mL | Freq: Once | INTRAMUSCULAR | Status: AC
  Administered 2015-03-04: 0.5 mL via INTRAMUSCULAR
  Filled 2015-03-04: qty 0.5

## 2015-03-04 NOTE — Discharge Instructions (Signed)
Read the information below.  Use the prescribed medication as directed.  Please discuss all new medications with your pharmacist.  Do not take additional tylenol while taking the prescribed pain medication to avoid overdose.  You may return to the Emergency Department at any time for worsening condition or any new symptoms that concern you.   If you develop increased redness, swelling, uncontrolled pain, inability to move your finger, or fevers greater than 100.4, return to the ER immediately for a recheck.     Abscess Care After An abscess (also called a boil or furuncle) is an infected area that contains a collection of pus. Signs and symptoms of an abscess include pain, tenderness, redness, or hardness, or you may feel a moveable soft area under your skin. An abscess can occur anywhere in the body. The infection may spread to surrounding tissues causing cellulitis. A cut (incision) by the surgeon was made over your abscess and the pus was drained out. Gauze may have been packed into the space to provide a drain that will allow the cavity to heal from the inside outwards. The boil may be painful for 5 to 7 days. Most people with a boil do not have high fevers. Your abscess, if seen early, may not have localized, and may not have been lanced. If not, another appointment may be required for this if it does not get better on its own or with medications. HOME CARE INSTRUCTIONS   Only take over-the-counter or prescription medicines for pain, discomfort, or fever as directed by your caregiver.  When you bathe, soak and then remove gauze or iodoform packs at least daily or as directed by your caregiver. You may then wash the wound gently with mild soapy water. Repack with gauze or do as your caregiver directs. SEEK IMMEDIATE MEDICAL CARE IF:   You develop increased pain, swelling, redness, drainage, or bleeding in the wound site.  You develop signs of generalized infection including muscle aches, chills,  fever, or a general ill feeling.  An oral temperature above 102 F (38.9 C) develops, not controlled by medication. See your caregiver for a recheck if you develop any of the symptoms described above. If medications (antibiotics) were prescribed, take them as directed. Document Released: 12/27/2004 Document Revised: 09/02/2011 Document Reviewed: 08/24/2007 [facility] Patient Information 2015 [place], [place]. This information is not intended to replace advice given to you by your health care provider. Make sure you discuss any questions you have with your health care provider.  Abscess An abscess is an infected area that contains a collection of pus and debris.It can occur in almost any part of the body. An abscess is also known as a furuncle or boil. CAUSES  An abscess occurs when tissue gets infected. This can occur from blockage of oil or sweat glands, infection of hair follicles, or a minor injury to the skin. As the body tries to fight the infection, pus collects in the area and creates pressure under the skin. This pressure causes pain. People with weakened immune systems have difficulty fighting infections and get certain abscesses more often.  SYMPTOMS Usually an abscess develops on the skin and becomes a painful mass that is red, warm, and tender. If the abscess forms under the skin, you may feel a moveable soft area under the skin. Some abscesses break open (rupture) on their own, but most will continue to get worse without care. The infection can spread deeper into the body and eventually into the bloodstream, causing you to  feel ill.  DIAGNOSIS  Your caregiver will take your medical history and perform a physical exam. A sample of fluid may also be taken from the abscess to determine what is causing your infection. TREATMENT  Your caregiver may prescribe antibiotic medicines to fight the infection. However, taking antibiotics alone usually does not cure an abscess. Your caregiver may need  to make a small cut (incision) in the abscess to drain the pus. In some cases, gauze is packed into the abscess to reduce pain and to continue draining the area. HOME CARE INSTRUCTIONS   Only take over-the-counter or prescription medicines for pain, discomfort, or fever as directed by your caregiver.  If you were prescribed antibiotics, take them as directed. Finish them even if you start to feel better.  If gauze is used, follow your caregiver's directions for changing the gauze.  To avoid spreading the infection:  Keep your draining abscess covered with a bandage.  Wash your hands well.  Do not share personal care items, towels, or whirlpools with others.  Avoid skin contact with others.  Keep your skin and clothes clean around the abscess.  Keep all follow-up appointments as directed by your caregiver. SEEK MEDICAL CARE IF:   You have increased pain, swelling, redness, fluid drainage, or bleeding.  You have muscle aches, chills, or a general ill feeling.  You have a fever. MAKE SURE YOU:   Understand these instructions.  Will watch your condition.  Will get help right away if you are not doing well or get worse. Document Released: 03/20/2005 Document Revised: 12/10/2011 Document Reviewed: 08/23/2011 [facility] Patient Information 2015 [place], [place]. This information is not intended to replace advice given to you by your health care provider. Make sure you discuss any questions you have with your health care provider.  Cellulitis Cellulitis is an infection of the skin and the tissue beneath it. The infected area is usually red and tender. Cellulitis occurs most often in the arms and lower legs.  CAUSES  Cellulitis is caused by bacteria that enter the skin through cracks or cuts in the skin. The most common types of bacteria that cause cellulitis are staphylococci and streptococci. SIGNS AND SYMPTOMS   Redness and warmth.  Swelling.  Tenderness or  pain.  Fever. DIAGNOSIS  Your health care provider can usually determine what is wrong based on a physical exam. Blood tests may also be done. TREATMENT  Treatment usually involves taking an antibiotic medicine. HOME CARE INSTRUCTIONS   Take your antibiotic medicine as directed by your health care provider. Finish the antibiotic even if you start to feel better.  Keep the infected arm or leg elevated to reduce swelling.  Apply a warm cloth to the affected area up to 4 times per day to relieve pain.  Take medicines only as directed by your health care provider.  Keep all follow-up visits as directed by your health care provider. SEEK MEDICAL CARE IF:   You notice red streaks coming from the infected area.  Your red area gets larger or turns dark in color.  Your bone or joint underneath the infected area becomes painful after the skin has healed.  Your infection returns in the same area or another area.  You notice a swollen bump in the infected area.  You develop new symptoms.  You have a fever. SEEK IMMEDIATE MEDICAL CARE IF:   You feel very sleepy.  You develop vomiting or diarrhea.  You have a general ill feeling (malaise) with muscle aches  and pains. MAKE SURE YOU:   Understand these instructions.  Will watch your condition.  Will get help right away if you are not doing well or get worse. Document Released: 03/20/2005 Document Revised: 10/25/2013 Document Reviewed: 08/26/2011 [facility] Patient Information 2015 [place], [place]. This information is not intended to replace advice given to you by your health care provider. Make sure you discuss any questions you have with your health care provider.

## 2015-03-07 ENCOUNTER — Telehealth (HOSPITAL_BASED_OUTPATIENT_CLINIC_OR_DEPARTMENT_OTHER): Payer: Self-pay | Admitting: Emergency Medicine

## 2015-03-07 LAB — CULTURE, ROUTINE-ABSCESS: GRAM STAIN: NONE SEEN

## 2015-03-08 NOTE — Telephone Encounter (Signed)
Post ED Visit - Positive Culture Follow-up  Culture report reviewed by antimicrobial stewardship pharmacist:   Celedonio Miyamoto, Pharm.D., BCPS  Georgina Pillion, Pharm.D., BCPS  [place], [address].D., BCPS, AAHIVP  Estella Husk, Pharm.D., BCPS, AAHIVP  [place], [address].D.  Tennis Must, Pharm.D.  Positive abcess culture MRSA Treated with Bactrim, organism sensitive to the same and no further patient follow-up is required at this time.  Berle Mull 03/08/2015, 10:00 AM

## 2015-04-03 ENCOUNTER — Encounter (HOSPITAL_COMMUNITY): Payer: Self-pay | Admitting: *Deleted

## 2015-04-03 ENCOUNTER — Emergency Department (HOSPITAL_COMMUNITY)
Admission: EM | Admit: 2015-04-03 | Discharge: 2015-04-03 | Disposition: A | Discharge status: Home / Self Care | Payer: 59 | Attending: Emergency Medicine | Admitting: Emergency Medicine

## 2015-04-03 DIAGNOSIS — T7840XA Allergy, unspecified, initial encounter: Secondary | ICD-10-CM | POA: Insufficient documentation

## 2015-04-03 DIAGNOSIS — Y929 Unspecified place or not applicable: Secondary | ICD-10-CM | POA: Insufficient documentation

## 2015-04-03 DIAGNOSIS — S40862A Insect bite (nonvenomous) of left upper arm, initial encounter: Secondary | ICD-10-CM | POA: Insufficient documentation

## 2015-04-03 DIAGNOSIS — W57XXXA Bitten or stung by nonvenomous insect and other nonvenomous arthropods, initial encounter: Secondary | ICD-10-CM | POA: Insufficient documentation

## 2015-04-03 DIAGNOSIS — L03114 Cellulitis of left upper limb: Secondary | ICD-10-CM | POA: Insufficient documentation

## 2015-04-03 DIAGNOSIS — Y999 Unspecified external cause status: Secondary | ICD-10-CM | POA: Insufficient documentation

## 2015-04-03 DIAGNOSIS — Z7982 Long term (current) use of aspirin: Secondary | ICD-10-CM | POA: Insufficient documentation

## 2015-04-03 DIAGNOSIS — Y9389 Activity, other specified: Secondary | ICD-10-CM | POA: Insufficient documentation

## 2015-04-03 DIAGNOSIS — Z87891 Personal history of nicotine dependence: Secondary | ICD-10-CM | POA: Insufficient documentation

## 2015-04-03 MED ORDER — DOXYCYCLINE HYCLATE 100 MG PO CAPS: 100.0000 mg | ORAL_CAPSULE | Freq: Two times a day (BID) | ORAL | Status: DC

## 2015-04-03 MED ORDER — PREDNISONE 20 MG PO TABS: 40.0000 mg | ORAL_TABLET | Freq: Every day | ORAL | Status: DC

## 2015-04-03 NOTE — Discharge Instructions (Signed)

## 2015-04-03 NOTE — ED Notes (Signed)
Pt reports possible insect bite to left arm yesterday. Initially had hives but pt took benadryl at home. Now has swelling, redness and itching to arm. Airway intact.

## 2015-04-03 NOTE — ED Provider Notes (Signed)
CSN: [id number]     Arrival date & time 04/03/15  1622 History  By signing my name below, I, Soijett Blue, attest that this documentation has been prepared under the direction and in the presence of Roxy Horseman, PA-C Electronically Signed: Soijett Blue, ED Scribe. 04/03/2015. 7:29 PM.   Chief Complaint  Patient presents with  . Insect Bite  . Arm Pain      The history is provided by the patient. No language interpreter was used.    Joseph Chapman is a 24 y.o. male who presents to the Emergency Department complaining of an insect bite to left arm onset yesterday. He reports that he was getting out of his truck when he felt something sting him on his inner upper left arm yesterday. He notes that he initially had hives to the area 15 minutes following the sting that was relieved with 25 mg benadryl last night and today. He reports that he has had residual redness and swelling since the sting yesterday. Pt is having associated symptoms of swelling of LUE, itching, and redness. Pt denies any other symptoms. Denies medical hx of DM. Pt notes that he is UTD with his tetanus.   History reviewed. No pertinent past medical history. History reviewed. No pertinent past surgical history. History reviewed. No pertinent family history. Social History  Substance Use Topics  . Smoking status: Former Games developer  . Smokeless tobacco: None  . Alcohol Use: No    Review of Systems  Musculoskeletal: Positive for joint swelling and arthralgias.  Skin: Positive for color change. Negative for rash and wound.      Allergies  Zithromax  Home Medications   Prior to Admission medications   Medication Sig Start Date End Date Taking? Authorizing Provider  acetaminophen (TYLENOL) 325 MG tablet Take 650 mg by mouth every 6 (six) hours as needed for mild pain, moderate pain or fever.    Historical Provider, MD  aspirin 325 MG tablet Take 325 mg by mouth every 4 (four) hours as needed.    Historical  Provider, MD  bacitracin-polymyxin b (POLYSPORIN) ophthalmic ointment Place 1 application into the right eye every 12 (twelve) hours. apply to eye every 12 hours while awake 10/13/13   Kaitlyn Szekalski, PA-C  cephALEXin (KEFLEX) 500 MG capsule Take 1 capsule (500 mg total) by mouth 4 (four) times daily. 03/04/15   Trixie Dredge, PA-C  doxycycline (VIBRAMYCIN) 100 MG capsule Take 1 capsule (100 mg total) by mouth 2 (two) times daily. One po bid x 7 days 10/24/14   Zadie Rhine, MD  HYDROcodone-acetaminophen (NORCO/VICODIN) 5-325 MG per tablet Take 1-2 tablets by mouth every 4 (four) hours as needed for moderate pain or severe pain. 03/04/15   Trixie Dredge, PA-C  ibuprofen (ADVIL,MOTRIN) 800 MG tablet Take 1 tablet (800 mg total) by mouth every 8 (eight) hours as needed for mild pain or moderate pain. 03/04/15   Trixie Dredge, PA-C   BP 123/68 mmHg  Pulse 106  Temp(Src) 98.7 F (37.1 C) (Oral)  Resp 18  SpO2 98% Physical Exam  Constitutional: He is oriented to person, place, and time. He appears well-developed and well-nourished. No distress.  HENT:  Head: Normocephalic and atraumatic.  Eyes: EOM are normal.  Neck: Neck supple.  Cardiovascular: Normal rate, regular rhythm, normal heart sounds and intact distal pulses.  Exam reveals no gallop and no friction rub.   No murmur heard. Distal pulses intact.   Pulmonary/Chest: Effort normal and breath sounds normal. No respiratory distress. He  has no wheezes. He has no rales.  Musculoskeletal: Normal range of motion.  Neurological: He is alert and oriented to person, place, and time.  Skin: Skin is warm and dry. There is erythema.  Erythema extending from the medial aspect of the left axilla to the medial aspect of the mid forearm. No obvious abcess. Small insect bite on the proximal arm. Moderate swelling of LUE.   Psychiatric: He has a normal mood and affect. His behavior is normal.  Nursing note and vitals reviewed.   ED Course  Procedures  (including critical care time) DIAGNOSTIC STUDIES: Oxygen Saturation is 98% on RA, nl by my interpretation.    COORDINATION OF CARE: 7:29 PM Discussed treatment plan with pt at bedside and pt agreed to plan.      MDM   Final diagnoses:  Insect bite  Cellulitis of left arm  Allergic reaction, initial encounter    Patient with allergic reaction and likely superimposed infection.  Seen by and discussed with Dr. Jeraldine Loots.  Will treat with prednisone and doxy.  Return in 2 days for wound check.  Return sooner if symptoms worsen.  I, Abdulkarim Eberlin, personally performed the services described in this documentation. All medical record entries made by the scribe were at my direction and in my presence.  I have reviewed the chart and discharge instructions and agree that the record reflects my personal performance and is accurate and complete. Markon Jares.  04/03/2015. 8:01 PM.      Roxy Horseman, PA-C 04/03/15 2001  Gerhard Munch, MD 04/03/15 [phone]

## 2015-04-29 ENCOUNTER — Encounter (HOSPITAL_COMMUNITY): Payer: Self-pay | Admitting: Emergency Medicine

## 2015-04-29 ENCOUNTER — Telehealth (HOSPITAL_COMMUNITY): Payer: Self-pay

## 2015-04-29 ENCOUNTER — Emergency Department (HOSPITAL_COMMUNITY)
Admission: EM | Admit: 2015-04-29 | Discharge: 2015-04-29 | Disposition: A | Discharge status: Home / Self Care | Payer: 59 | Attending: Emergency Medicine | Admitting: Emergency Medicine

## 2015-04-29 DIAGNOSIS — Y9389 Activity, other specified: Secondary | ICD-10-CM | POA: Insufficient documentation

## 2015-04-29 DIAGNOSIS — Y998 Other external cause status: Secondary | ICD-10-CM | POA: Insufficient documentation

## 2015-04-29 DIAGNOSIS — Y9289 Other specified places as the place of occurrence of the external cause: Secondary | ICD-10-CM | POA: Insufficient documentation

## 2015-04-29 DIAGNOSIS — Z87891 Personal history of nicotine dependence: Secondary | ICD-10-CM | POA: Insufficient documentation

## 2015-04-29 DIAGNOSIS — X58XXXA Exposure to other specified factors, initial encounter: Secondary | ICD-10-CM | POA: Insufficient documentation

## 2015-04-29 DIAGNOSIS — T782XXA Anaphylactic shock, unspecified, initial encounter: Secondary | ICD-10-CM | POA: Insufficient documentation

## 2015-04-29 MED ORDER — EPINEPHRINE 0.3 MG/0.3ML IJ SOAJ
0.3000 mg | Freq: Once | INTRAMUSCULAR | Status: AC
  Administered 2015-04-29: 0.3 mg via INTRAMUSCULAR
  Filled 2015-04-29: qty 0.3

## 2015-04-29 MED ORDER — FAMOTIDINE IN NACL 20-0.9 MG/50ML-% IV SOLN
20.0000 mg | Freq: Once | INTRAVENOUS | Status: AC
  Administered 2015-04-29: 20 mg via INTRAVENOUS
  Filled 2015-04-29: qty 50

## 2015-04-29 MED ORDER — EPINEPHRINE 0.3 MG/0.3ML IJ SOAJ: 0.3000 mg | Freq: Once | INTRAMUSCULAR | Status: AC | PRN

## 2015-04-29 MED ORDER — PREDNISONE 20 MG PO TABS: 60.0000 mg | ORAL_TABLET | Freq: Every day | ORAL | Status: DC

## 2015-04-29 MED ORDER — DIPHENHYDRAMINE HCL 50 MG/ML IJ SOLN
25.0000 mg | Freq: Once | INTRAMUSCULAR | Status: AC
  Administered 2015-04-29: 25 mg via INTRAVENOUS
  Filled 2015-04-29: qty 1

## 2015-04-29 MED ORDER — METHYLPREDNISOLONE SODIUM SUCC 125 MG IJ SOLR
125.0000 mg | Freq: Once | INTRAMUSCULAR | Status: AC
  Administered 2015-04-29: 125 mg via INTRAVENOUS
  Filled 2015-04-29: qty 2

## 2015-04-29 NOTE — ED Notes (Signed)
Pt was brought into the ED due to an allergic reaction.  He has hives and is itching over most of his body.  He is not aware of what might have caused this reaction.  He did take 25mg  of benadryl prior to leaving for the hospital.

## 2015-04-29 NOTE — Discharge Instructions (Signed)
Anaphylactic Reaction Joseph Chapman, caring EpiPen along with you everywhere you go. If your symptoms come back, use the pen and then come back to the emergency department immediately. See a primary care physician within 3 days for close follow-up. Continue to take steroids for 4 more days to complete treatment. For any worsening symptoms come back to the ED immediately. Thank you. An anaphylactic reaction is a sudden, severe allergic reaction. It affects the whole body. It can be life threatening. You may need to stay in the hospital.  Hull a medical bracelet or necklace that lists your allergy.  Carry your allergy kit or medicine shot to treat severe allergic reactions with you. These can save your life.  Do not drive until medicine from your shot has worn off, unless your doctor says it is okay.  If you have hives or a rash:  Take medicine as told by your doctor.  You may take over-the-counter antihistamine medicine.  Place cold cloths on your skin. Take baths in cool water. Avoid hot baths and hot showers. GET HELP RIGHT AWAY IF:   Your mouth is puffy (swollen), or you have trouble breathing.  You start making whistling sounds when you breathe (wheezing).  You have a tight feeling in your chest or throat.  You have a rash, hives, puffiness, or itching on your body.  You throw up (vomit) or have watery poop (diarrhea).  You feel dizzy or pass out (faint).  You think you are having an allergic reaction.  You have new symptoms. This is an emergency. Use your medicine shot or allergy kit as told. Call your local emergency services (911 in [place] Even if you feel better after the shot, you need to go to the hospital emergency department. MAKE SURE YOU:   Understand these instructions.  Will watch your condition.  Will get help right away if you are not doing well or get worse.   This information is not intended to replace advice given to you by your health care  provider. Make sure you discuss any questions you have with your health care provider.   Document Released: 11/27/2007 Document Revised: 12/10/2011 Document Reviewed: 12/21/2014 Elsevier Interactive Patient Education [employer].

## 2015-04-29 NOTE — ED Provider Notes (Signed)
CSN: [id number]     Arrival date & time 04/29/15  [id number] History   First MD Initiated Contact with Patient 04/29/15 0440     No chief complaint on file.    (Consider location/radiation/quality/duration/timing/severity/associated sxs/prior Treatment) HPI   Joseph Chapman is a 24 y.o. male with no significant past medical history presenting today for pruritus throughout his body. He states he woke up approximately 30 minutes ago with uncontrollable itching. He denies having interaction with anything he is allergic to. He states he is only allergic to azithromycin. He did have homemade apple brandy last night as well as a new steak sauce that he has never had before. He denies any new fragrances.  He denies any shortness of breath or feelings of his throat closing. Patient has no further complaints.   10 Systems reviewed and are negative for acute change except as noted in the HPI.   No past medical history on file. No past surgical history on file. No family history on file. Social History  Substance Use Topics  . Smoking status: Former Games developermoker  . Smokeless tobacco: Not on file  . Alcohol Use: No    Review of Systems    Allergies  Zithromax  Home Medications   Prior to Admission medications   Medication Sig Start Date End Date Taking? Authorizing Provider  acetaminophen (TYLENOL) 325 MG tablet Take 650 mg by mouth every 6 (six) hours as needed for mild pain, moderate pain or fever.    Historical Provider, MD  aspirin 325 MG tablet Take 325 mg by mouth every 4 (four) hours as needed.    Historical Provider, MD  bacitracin-polymyxin b (POLYSPORIN) ophthalmic ointment Place 1 application into the right eye every 12 (twelve) hours. apply to eye every 12 hours while awake 10/13/13   Emilia BeckKaitlyn Szekalski, PA-C  doxycycline (VIBRAMYCIN) 100 MG capsule Take 1 capsule (100 mg total) by mouth 2 (two) times daily. One po bid x 7 days 04/03/15   Roxy Horsemanobert Browning, PA-C  HYDROcodone-acetaminophen  (NORCO/VICODIN) 5-325 MG per tablet Take 1-2 tablets by mouth every 4 (four) hours as needed for moderate pain or severe pain. 03/04/15   Trixie DredgeEmily West, PA-C  ibuprofen (ADVIL,MOTRIN) 800 MG tablet Take 1 tablet (800 mg total) by mouth every 8 (eight) hours as needed for mild pain or moderate pain. 03/04/15   Trixie DredgeEmily West, PA-C  predniSONE (DELTASONE) 20 MG tablet Take 2 tablets (40 mg total) by mouth daily. 04/03/15   Roxy Horsemanobert Browning, PA-C   There were no vitals taken for this visit. Physical Exam  Constitutional: He is oriented to person, place, and time. Vital signs are normal. He appears well-developed and well-nourished.  Non-toxic appearance. He does not appear ill. No distress.  HENT:  Head: Normocephalic and atraumatic.  Nose: Nose normal.  Mouth/Throat: Oropharynx is clear and moist. No oropharyngeal exudate.  No oral swelling or airway involvement.  Eyes: Conjunctivae and EOM are normal. Pupils are equal, round, and reactive to light. No scleral icterus.  Neck: Normal range of motion. Neck supple. No tracheal deviation, no edema, no erythema and normal range of motion present. No thyroid mass and no thyromegaly present.  Cardiovascular: Normal rate, regular rhythm, S1 normal, S2 normal, normal heart sounds, intact distal pulses and normal pulses.  Exam reveals no gallop and no friction rub.   No murmur heard. Pulmonary/Chest: Effort normal and breath sounds normal. No respiratory distress. He has no wheezes. He has no rhonchi. He has no rales.  Abdominal: Soft.  Normal appearance and bowel sounds are normal. He exhibits no distension, no ascites and no mass. There is no hepatosplenomegaly. There is no tenderness. There is no rebound, no guarding and no CVA tenderness.  Musculoskeletal: Normal range of motion. He exhibits no edema or tenderness.  Lymphadenopathy:    He has no cervical adenopathy.  Neurological: He is alert and oriented to person, place, and time. He has normal strength. No  cranial nerve deficit or sensory deficit.  Skin: Skin is warm, dry and intact. Rash noted. No petechiae noted. He is not diaphoretic. No erythema. No pallor.  Urticarial rash seen diffusely throughout his back and chest upper and lower extremities.  Psychiatric: He has a normal mood and affect. His behavior is normal. Judgment normal.  Nursing note and vitals reviewed.   ED Course  Procedures (including critical care time) Labs Review Labs Reviewed - No data to display  Imaging Review No results found. I have personally reviewed and evaluated these images and lab results as part of my medical decision-making.   EKG Interpretation None      MDM   Final diagnoses:  None    Patient presents emergency department for an acute allergic reaction. He was given epinephrine, Solu-Medrol, famotidine, Benadryl for treatment. He did take Benadryl at home 25 mg, 25 more mg were given to him IV. Will observe for 3 hours for improvement prior to discharge. Patient will be signed out to oncoming provider. If patient's pruritus and rash have improved, he is safe for discharge with family care follow-up. I provided prescriptions and discharge instructions for him.  Tomasita Crumble, MD 04/29/15 [phone]

## 2015-04-29 NOTE — Telephone Encounter (Signed)
Unable to contact pt by mail or telephone. Unable to communicate lab results or treatment changes. 

## 2015-06-21 ENCOUNTER — Emergency Department (HOSPITAL_COMMUNITY)
Admission: EM | Admit: 2015-06-21 | Discharge: 2015-06-22 | Disposition: A | Discharge status: Home / Self Care | Payer: 59 | Attending: Emergency Medicine | Admitting: Emergency Medicine

## 2015-06-21 ENCOUNTER — Encounter (HOSPITAL_COMMUNITY): Payer: Self-pay | Admitting: Emergency Medicine

## 2015-06-21 DIAGNOSIS — Z79899 Other long term (current) drug therapy: Secondary | ICD-10-CM | POA: Insufficient documentation

## 2015-06-21 DIAGNOSIS — L509 Urticaria, unspecified: Secondary | ICD-10-CM | POA: Insufficient documentation

## 2015-06-21 DIAGNOSIS — F17210 Nicotine dependence, cigarettes, uncomplicated: Secondary | ICD-10-CM | POA: Insufficient documentation

## 2015-06-21 MED ORDER — METHYLPREDNISOLONE SODIUM SUCC 125 MG IJ SOLR
125.0000 mg | Freq: Once | INTRAMUSCULAR | Status: AC
  Administered 2015-06-22: 125 mg via INTRAVENOUS
  Filled 2015-06-21: qty 2

## 2015-06-21 MED ORDER — FAMOTIDINE IN NACL 20-0.9 MG/50ML-% IV SOLN
20.0000 mg | Freq: Once | INTRAVENOUS | Status: AC
  Administered 2015-06-22: 20 mg via INTRAVENOUS
  Filled 2015-06-21: qty 50

## 2015-06-21 NOTE — ED Notes (Signed)
Pt. reports itchy rashes/hives at torso , arms and legs onset this evening unrelieved by OTC Benadryl , airway intact / respirations unlabored . No fever or chills.

## 2015-06-22 MED ORDER — PREDNISONE 10 MG PO TABS: 20.0000 mg | ORAL_TABLET | Freq: Two times a day (BID) | ORAL | Status: AC

## 2015-06-22 NOTE — Discharge Instructions (Signed)
Prednisone as prescribed.  Benadryl 25 mg every 6 hours for the next 2-3 days.  Turn to the ER symptoms significant only worsen or change.   Hives Hives are itchy, red, swollen areas of the skin. They can vary in size and location on your body. Hives can come and go for hours or several days (acute hives) or for several weeks (chronic hives). Hives do not spread from person to person (noncontagious). They may get worse with scratching, exercise, and emotional stress. CAUSES   Allergic reaction to food, additives, or drugs.  Infections, including the common cold.  Illness, such as vasculitis, lupus, or thyroid disease.  Exposure to sunlight, heat, or cold.  Exercise.  Stress.  Contact with chemicals. SYMPTOMS   Red or white swollen patches on the skin. The patches may change size, shape, and location quickly and repeatedly.  Itching.  Swelling of the hands, feet, and face. This may occur if hives develop deeper in the skin. DIAGNOSIS  Your caregiver can usually tell what is wrong by performing a physical exam. Skin or blood tests may also be done to determine the cause of your hives. In some cases, the cause cannot be determined. TREATMENT  Mild cases usually get better with medicines such as antihistamines. Severe cases may require an emergency epinephrine injection. If the cause of your hives is known, treatment includes avoiding that trigger.  HOME CARE INSTRUCTIONS   Avoid causes that trigger your hives.  Take antihistamines as directed by your caregiver to reduce the severity of your hives. Non-sedating or low-sedating antihistamines are usually recommended. Do not drive while taking an antihistamine.  Take any other medicines prescribed for itching as directed by your caregiver.  Wear loose-fitting clothing.  Keep all follow-up appointments as directed by your caregiver. SEEK MEDICAL CARE IF:   You have persistent or severe itching that is not relieved with  medicine.  You have painful or swollen joints. SEEK IMMEDIATE MEDICAL CARE IF:   You have a fever.  Your tongue or lips are swollen.  You have trouble breathing or swallowing.  You feel tightness in the throat or chest.  You have abdominal pain. These problems may be the first sign of a life-threatening allergic reaction. Call your local emergency services (911 in [place] MAKE SURE YOU:   Understand these instructions.  Will watch your condition.  Will get help right away if you are not doing well or get worse.   This information is not intended to replace advice given to you by your health care provider. Make sure you discuss any questions you have with your health care provider.   Document Released: 06/10/2005 Document Revised: 06/15/2013 Document Reviewed: 09/03/2011 Elsevier Interactive Patient Education [employer] Elsevier Inc[employer]

## 2015-06-22 NOTE — ED Notes (Signed)
Patient states he is feeling better and ready to go home. No SOB noted

## 2015-06-22 NOTE — ED Provider Notes (Signed)
CSN: [id number]     Arrival date & time 06/21/15  2326 History   First MD Initiated Contact with Patient 06/22/15 0022     Chief Complaint  Patient presents with  . Allergic Reaction  . Urticaria     (Consider location/radiation/quality/duration/timing/severity/associated sxs/prior Treatment) HPI Comments: Patient is a 24 year old male with no significant past medical history. He presents for evaluation of hives. He states he was lying in bed this evening when he suddenly became itchy and broke out in hives on his torso, arms, and legs. He denies any new exposures, foods, soaps, detergents, etc. He states his throat felt somewhat scratchy, however denies any difficulty breathing or swallowing.  He had a similar episode several months ago that was treated with steroids and any histamines. He is unsure of what caused this episode either.  Patient is a 24 y.o. male presenting with allergic reaction. The history is provided by the patient.  Allergic Reaction Presenting symptoms: itching, rash and swelling   Presenting symptoms: no difficulty breathing and no difficulty swallowing   Severity:  Moderate Context comment:  Unknown Relieved by:  Nothing Worsened by:  Nothing tried Ineffective treatments:  None tried   History reviewed. No pertinent past medical history. History reviewed. No pertinent past surgical history. No family history on file. Social History  Substance Use Topics  . Smoking status: Current Every Day Smoker -- 0.00 packs/day    Types: Cigarettes  . Smokeless tobacco: None  . Alcohol Use: No    Review of Systems  HENT: Negative for trouble swallowing.   Skin: Positive for itching and rash.  All other systems reviewed and are negative.     Allergies  Zithromax  Home Medications   Prior to Admission medications   Medication Sig Start Date End Date Taking? Authorizing Provider  buprenorphine-naloxone (SUBOXONE) 8-2 MG SUBL SL tablet Place 1 tablet under  the tongue 2 (two) times daily. 04/25/15  Yes Historical Provider, MD  diphenhydrAMINE (BENADRYL) 25 mg capsule Take 25 mg by mouth every 6 (six) hours as needed for allergies.   Yes Historical Provider, MD  EPINEPHrine 0.3 mg/0.3 mL IJ SOAJ injection Inject 0.3 mLs (0.3 mg total) into the muscle once as needed (allergic reaction). 04/29/15  Yes Tomasita CrumbleAdeleke Oni, MD  bacitracin-polymyxin b (POLYSPORIN) ophthalmic ointment Place 1 application into the right eye every 12 (twelve) hours. apply to eye every 12 hours while awake Patient not taking: Reported on 04/29/2015 10/13/13   Emilia BeckKaitlyn Szekalski, PA-C  doxycycline (VIBRAMYCIN) 100 MG capsule Take 1 capsule (100 mg total) by mouth 2 (two) times daily. One po bid x 7 days Patient not taking: Reported on 04/29/2015 04/03/15   Roxy Horsemanobert Browning, PA-C  HYDROcodone-acetaminophen (NORCO/VICODIN) 5-325 MG per tablet Take 1-2 tablets by mouth every 4 (four) hours as needed for moderate pain or severe pain. Patient not taking: Reported on 04/29/2015 03/04/15   Trixie DredgeEmily West, PA-C  ibuprofen (ADVIL,MOTRIN) 800 MG tablet Take 1 tablet (800 mg total) by mouth every 8 (eight) hours as needed for mild pain or moderate pain. Patient not taking: Reported on 04/29/2015 03/04/15   Trixie DredgeEmily West, PA-C  predniSONE (DELTASONE) 20 MG tablet Take 3 tablets (60 mg total) by mouth daily. Patient not taking: Reported on 06/22/2015 04/29/15   Tomasita CrumbleAdeleke Oni, MD   BP 133/56 mmHg  Pulse 110  Resp 18  Ht 5\' 5"  (1.651 m)  Wt 163 lb (73.936 kg)  BMI 27.12 kg/m2  SpO2 97% Physical Exam  Constitutional: He is oriented  to person, place, and time. He appears well-developed and well-nourished. No distress.  HENT:  Head: Normocephalic and atraumatic.  Mouth/Throat: Oropharynx is clear and moist.  Neck: Normal range of motion. Neck supple.  Cardiovascular: Normal rate, regular rhythm and normal heart sounds.   No murmur heard. Pulmonary/Chest: Effort normal and breath sounds normal. No respiratory  distress. He has no wheezes. He has no rales.  Abdominal: Soft. Bowel sounds are normal.  Musculoskeletal: Normal range of motion. He exhibits no edema.  Neurological: He is alert and oriented to person, place, and time.  Skin: Skin is warm and dry. He is not diaphoretic.  Nursing note and vitals reviewed.   ED Course  Procedures (including critical care time) Labs Review Labs Reviewed - No data to display  Imaging Review No results found. I have personally reviewed and evaluated these images and lab results as part of my medical decision-making.   EKG Interpretation None      MDM   Final diagnoses:  None    Patient much improved after medications given in the ER. There are no signs of anaphylaxis. He will be discharged with steroids, and antihistamines, and when necessary return.    Geoffery Lyons, MD 06/22/15 [phone]

## 2015-06-22 NOTE — ED Notes (Signed)
Patient verbalized understanding of discharge instructions and denies any further needs or questions at this time. VS stable. Patient ambulatory with steady gait.  

## 2015-06-22 NOTE — ED Notes (Signed)
Patient presents with red raised areas all over his ext and trunk.  Stated he was laying in bed and his throat started itching.  He got up and noticed his feet were red and starting to itch.  Stated his lips were tingling but denies SOB

## 2015-06-22 NOTE — ED Notes (Signed)
Patient states he was here about 2 weeks ago for the same thing and never figured out what was causing the redness and itching.

## 2015-08-18 ENCOUNTER — Encounter (HOSPITAL_COMMUNITY): Payer: Self-pay | Admitting: *Deleted

## 2015-08-18 ENCOUNTER — Emergency Department (HOSPITAL_COMMUNITY)
Admission: EM | Admit: 2015-08-18 | Discharge: 2015-08-18 | Disposition: A | Discharge status: Home / Self Care | Payer: Self-pay | Attending: Emergency Medicine | Admitting: Emergency Medicine

## 2015-08-18 DIAGNOSIS — Z7952 Long term (current) use of systemic steroids: Secondary | ICD-10-CM | POA: Insufficient documentation

## 2015-08-18 DIAGNOSIS — F17210 Nicotine dependence, cigarettes, uncomplicated: Secondary | ICD-10-CM | POA: Insufficient documentation

## 2015-08-18 DIAGNOSIS — Z79891 Long term (current) use of opiate analgesic: Secondary | ICD-10-CM | POA: Insufficient documentation

## 2015-08-18 DIAGNOSIS — K0889 Other specified disorders of teeth and supporting structures: Secondary | ICD-10-CM | POA: Insufficient documentation

## 2015-08-18 MED ORDER — PENICILLIN V POTASSIUM 500 MG PO TABS: 500.0000 mg | ORAL_TABLET | Freq: Four times a day (QID) | ORAL | Status: AC

## 2015-08-18 MED ORDER — LIDOCAINE VISCOUS 2 % MT SOLN: 20.0000 mL | OROMUCOSAL | Status: AC | PRN

## 2015-08-18 MED ORDER — IBUPROFEN 800 MG PO TABS: 800.0000 mg | ORAL_TABLET | Freq: Three times a day (TID) | ORAL | Status: AC

## 2015-08-18 MED ORDER — IBUPROFEN 400 MG PO TABS
800.0000 mg | ORAL_TABLET | Freq: Once | ORAL | Status: AC
  Administered 2015-08-18: 800 mg via ORAL
  Filled 2015-08-18: qty 2

## 2015-08-18 NOTE — ED Notes (Signed)
Pt states his upper left wisdom tooth is coming in and he is wondering if it abscessed. States he needs a referral to a dentist, does not have insurance. Pt also requesting a doctors note for school.

## 2015-08-18 NOTE — Discharge Instructions (Signed)
You have been seen today for tooth pain. Follow up with a dentist as soon as possible. Ideally, before you finish the antibiotics. Please take all of your antibiotics until finished!   You may develop abdominal discomfort or diarrhea from the antibiotic.  You may help offset this with probiotics which you can buy or get in yogurt. Do not eat or take the probiotics until 2 hours after your antibiotic. Follow up with PCP as needed. Return to ED should symptoms worsen. Take 800 mg of ibuprofen every 8 hours for pain and inflammation.   Emergency Department Resource Guide 1) Find a Doctor and Pay Out of Pocket Although you won't have to find out who is covered by your insurance plan, it is a good idea to ask around and get recommendations. You will then need to call the office and see if the doctor you have chosen will accept you as a new patient and what types of options they offer for patients who are self-pay. Some doctors offer discounts or will set up payment plans for their patients who do not have insurance, but you will need to ask so you aren't surprised when you get to your appointment.  2) Contact Your Local Health Department Not all health departments have doctors that can see patients for sick visits, but many do, so it is worth a call to see if yours does. If you don't know where your local health department is, you can check in your phone book. The CDC also has a tool to help you locate your state's health department, and many state websites also have listings of all of their local health departments.  3) Find a Walk-in Clinic If your illness is not likely to be very severe or complicated, you may want to try a walk in clinic. These are popping up all over the country in pharmacies, drugstores, and shopping centers. They're usually staffed by nurse practitioners or physician assistants that have been trained to treat common illnesses and complaints. They're usually fairly quick and inexpensive.  However, if you have serious medical issues or chronic medical problems, these are probably not your best option.  No Primary Care Doctor: - Call Health Connect at  [phone] - they can help you locate a primary care doctor that  accepts your insurance, provides certain services, etc. - Physician Referral Service- [phone]  Chronic Pain Problems: Organization         Address  Phone   Notes  [facility]  [phone] Patients need to be referred by their primary care doctor.   Medication Assistance: Organization         Address  Phone   Notes  [facility] Medication [facility] [address] [place], [place] [address] [phone] --Must be a resident of [facility] -- Must have NO insurance coverage whatsoever (no Medicaid/ Medicare, etc.) -- The pt. MUST have a primary care doctor that directs their care regularly and follows them in the community   [facility]  [phone]   [employer]  [phone]    Agencies that provide inexpensive medical care: Organization         Address  Phone   Notes  [facility]  [phone]   [facility]    [phone]   [facility] [address] [place], [place] [address] [phone]   [facility] [address] [place] [place] [phone])  [phone]   Planned Parenthood    [phone]   [facility]    [phone]   [facility]  [address], [place] Phone:  [phone], Fax:  [phone] Hours of Operation:  9 am - 6 pm, M-F.  Also accepts Medicaid/Medicare and self-pay.  [facility]  [address], [place] Phone: [phone], Fax: [phone]. Hours of Operation:  8:30 am - 5:30 pm, M-F.  Also accepts Medicaid and self-pay.  [facility] [place] [address], [place] Phone: [phone]   [facility] [address] Natasha Bence [place], [place] [phone], Ext. [phone] Mondays & Thursdays: 7-9 AM.  First 15 patients are seen on a first come, first serve basis.    Medicaid-accepting [facility] Providers:  Organization         Address  Phone   Notes  [facility] [address], [place] [phone] Also accepts self-pay patients.  [facility] [address] Laurell Josephs [place], [place]  [phone]   [facility] [address], [place] [phone]   [facility] [address] [place] [phone]   Renaye Rakers [address], [place]   [phone] Only accepts [place] Access [place] patients after they have their name applied to their card.   Self-Pay (no insurance) in [facility]:  Organization         Address  Phone   Notes  Sickle Cell Patients, [facility] [address] [place], [place] [phone]   [facility] [address] [place], [place] [phone]   [facility] [place]  [address], [place] [phone]   [facility]/Dr. Osei-Bonsu  [address] [place] or [address], [place] [phone] Phone number for both [place] and [place] locations is the same.  [facility]gent [facility] and [facility] [address], [place] [phone]   [facility] [address] [place] or [address] [phone]   [facility] [address], [place] [phone], phone; [phone], fax Sees patients 1st and 3rd Saturday of every month.  Must not qualify for public or private insurance (i.e. Medicaid, Medicare, [place] Health Choice, Veterans' Benefits)  Household income should be no more than 200% of the poverty level The clinic cannot treat you if you are pregnant or think  you are pregnant  Sexually transmitted diseases are not treated at the clinic.    Dental Care: Organization         Address  Phone  Notes  [facility] [address] [place], [place] [phone] Accepts children up to age 72 who are enrolled in [place] or [place] Health Choice; pregnant women with a Medicaid card; and children who have applied for Medicaid or [place] Health Choice, but were declined, whose parents can pay a reduced fee at time of service.  [facility]  [address], [place] [phone] Accepts children up to age 62 who are enrolled in [place] or [place] Health Choice; pregnant women with a Medicaid card; and children who have applied for Medicaid or [place] Health Choice,  but were declined, whose parents can pay a reduced fee at time of service.  [facility]  [address] [place], [place] [phone] Patients are seen by appointment only. Walk-ins are not accepted. [facility] will see patients 25 years of age and older. Monday - Tuesday (8am-5pm) Most Wednesdays (8:30-5pm) $30 per visit, cash only  [facility] Adult Dental Access PRO[facility]  [address], [facility] [phone] Patients are seen by appointment only. Walk-ins are not accepted. [facility] will see patients 91 years of age and older. One Wednesday Evening (Monthly: Volunteer Based).  $30 per visit, cash only  [employer]  [phone] for adults; Children under age 73, call Graduate Pediatric Dentistry at [phone]. Children aged 33-14, please call [phone] to request a pediatric application.  Dental services are provided in all areas of dental care including fillings, crowns and bridges, complete and partial dentures, implants, gum treatment, root canals, and extractions. Preventive care is also provided. Treatment is provided to both adults and  children. Patients are selected via a lottery and there is often a waiting list.   [facility] [address], [place]  [phone] [email or web address]   [facility] [address] [place], [place] [phone], Ext. [phone] Second and Fourth Thursday of each month, opens at 6:30 AM; Clinic ends at 9 AM.  Patients are seen on a first-come first-served basis, and a limited number are seen during each clinic.   [facility]  [address] Ether Griffins [place], [place] [phone]   Eligibility Requirements You must have lived in [place], [place], or [place] counties for at least the last three months.   You cannot be eligible for state or federal sponsored [place], including [employer], [place], or [employer].   You generally cannot be eligible for healthcare insurance through your employer.    How to apply: Eligibility screenings are held every Tuesday and Wednesday afternoon from 1:00 pm until 4:00 pm. You do not need an appointment for the interview!  [facility] [address], [place], [place] [phone]   [facility] Health Department  [phone]   [facility] Health Department  [phone]   [facility]  [phone]    Behavioral Health Resources in the Community: Intensive Outpatient Programs Organization         Address  Phone  Notes  [facility] [address] [place], [place] [phone]   [facility] [address], [place], [place] [phone]   [facility] [address] [place], [place]  [phone]   [facility] [address]  [place], [place] [phone] or [phone]   Substance Abuse Resources Organization         Address  Phone  Notes  [facility]  [phone]   [facility]  [phone]   [facility]  [phone]     Floydene Flock  [phone]   Residential & Outpatient Substance Abuse Program  [phone]   Psychological Services Organization         Address  Phone  Notes  [facility]  [phone]   [facility]  [phone]   [facility] [address], [place] [phone] or [phone]    Mobile Crisis Teams Organization         Address  Phone  Notes  [facility], Mobile Crisis Care Unit  [phone]   [facility]  [address] [place], [place]   [facility] [address], [place] [phone]    Self-Help/Support Groups Organization         Address  Phone             Notes  [place] [place] - variety of support groups  [place] Call for more information  Narcotics Anonymous (NA), [facility] [address], [employer] [place]  2 meetings at this location   Special educational needs teacher         Address  Phone  Notes  [facility] [place],    [place]  [phone]   [facility]  [address] [place] [phone], [place], [place]   [place], [place] [phone] Admissions: 8am-3pm M-F  Incentives Substance [facility] [address]    [place], [place] [phone]   [facility] [address] [place], [place], [place]   [facility] [address]  [place], [place]   [facility] - Intensive Outpatient [address] Kristeen Mans 25, [place], [place]   [facility].,  [place], [place] [phone] or [phone]   [facility] ([facility]) [address] [place], [place] Accepts Medicaid  [facility] [address],  [place] [phone] Substance Abuse/Addiction Treatment   [facility] Organization         Address  Phone  Notes  [facility]  [phone]   Domenic Schwab, PhD [address] Arlis Porta [place], [place]   [phone] or [phone]   [facility] [place], [place] [phone]   [facility] [address], [place], [place] [phone] Insurance/Medicaid/sponsorship through [facility] and Families [address] [place]                                    [place], [place] [phone] [place] [address], [place] [phone]    Dr. Adele Schilder  [phone]   [facility] Dept. 1) [address], [place] 2) [place] 3)  [place] [address], [place] [phone]  [phone]   [place] [phone] or [phone] (After Hours)

## 2015-08-18 NOTE — ED Provider Notes (Signed)
CSN: [id number]     Arrival date & time 08/18/15  1918 History  By signing my name below, I, Lyndel Safe, attest that this documentation has been prepared under the direction and in the presence of Isayah Ignasiak, PA-C. Electronically Signed: Lyndel Safe, ED Scribe. 08/18/2015. 8:27 PM.   Chief Complaint  Patient presents with  . Dental Pain   The history is provided by the patient. No language interpreter was used.   HPI Comments: KOLBY SCHARA is a 25 y.o. male, with no chronic medical conditions, who presents to the Emergency Department complaining of a constant, severe, dull ache and stabbing pain to left upper most posterior molar X 2 days. Pt reports he thinks his wisdom tooth is coming in. He has applied Orajel to the affected area and taken Aspirin without relief. His pain is exacerbated with movement of jaw and chewing. Pt denies nausea, vomiting, fevers, chills, difficulty swallowing, or any other complaints.   History reviewed. No pertinent past medical history. History reviewed. No pertinent past surgical history. No family history on file. Social History  Substance Use Topics  . Smoking status: Current Every Day Smoker -- 0.00 packs/day    Types: Cigarettes  . Smokeless tobacco: None  . Alcohol Use: No    Review of Systems  Constitutional: Negative for fever and chills.  HENT: Positive for dental problem.   Gastrointestinal: Negative for nausea and vomiting.   Allergies  Zithromax  Home Medications   Prior to Admission medications   Medication Sig Start Date End Date Taking? Authorizing Provider  buprenorphine-naloxone (SUBOXONE) 8-2 MG SUBL SL tablet Place 1 tablet under the tongue 2 (two) times daily. 04/25/15   Historical Provider, MD  diphenhydrAMINE (BENADRYL) 25 mg capsule Take 25 mg by mouth every 6 (six) hours as needed for allergies.    Historical Provider, MD  EPINEPHrine 0.3 mg/0.3 mL IJ SOAJ injection Inject 0.3 mLs (0.3 mg total) into the muscle  once as needed (allergic reaction). 04/29/15   Tomasita Crumble, MD  ibuprofen (ADVIL,MOTRIN) 800 MG tablet Take 1 tablet (800 mg total) by mouth 3 (three) times daily. 08/18/15   Alfonzo Arca C Delorean Knutzen, PA-C  lidocaine (XYLOCAINE) 2 % solution Use as directed 20 mLs in the mouth or throat as needed for mouth pain. 08/18/15   Takeshi Teasdale C Kyandre Okray, PA-C  penicillin v potassium (VEETID) 500 MG tablet Take 1 tablet (500 mg total) by mouth 4 (four) times daily. 08/18/15 08/25/15  Naziah Weckerly C Cornisha Zetino, PA-C  predniSONE (DELTASONE) 10 MG tablet Take 2 tablets (20 mg total) by mouth 2 (two) times daily. 06/22/15   Geoffery Lyons, MD   BP 131/81 mmHg  Pulse 85  Temp(Src) 98.6 F (37 C) (Oral)  Resp 18  Ht 5' (1.524 m)  Wt 157 lb 12.8 oz (71.578 kg)  BMI 30.82 kg/m2  SpO2 97% Physical Exam  Constitutional: He is oriented to person, place, and time. He appears well-developed and well-nourished. No distress.  HENT:  Head: Normocephalic.  Mouth/Throat: Oropharynx is clear and moist. No oropharyngeal exudate.  No discernable area of fluctuance, tenderness to left upper gingiva posterior to the last molar.  Eyes: Conjunctivae are normal.  Neck: Normal range of motion. Neck supple.  Cardiovascular: Normal rate.   Pulmonary/Chest: Effort normal. No respiratory distress.  Musculoskeletal: Normal range of motion.  Neurological: He is alert and oriented to person, place, and time. Coordination normal.  Skin: Skin is warm.  Psychiatric: He has a normal mood and affect. His behavior is  normal.  Nursing note and vitals reviewed.   ED Course  Procedures  DIAGNOSTIC STUDIES: Oxygen Saturation is 97% on RA, normal by my interpretation.    COORDINATION OF CARE: 8:26 PM Discussed treatment plan which includes to order and prescribe NSAIDs with pt. Offered to preform dental block, however pt declined. Pt acknowledges and agrees to plan.    MDM   Final diagnoses:  Tooth pain    Asencion Partridge presents with left upper tooth pain for the  last 2 days.  Patient with dentalgia.  No abscess requiring immediate incision and drainage.  Exam not concerning for Ludwig's angina or pharyngeal abscess.  Patient is afebrile, not tachycardic, and has no difficulty swallowing or breathing. Patient was offered an alveolar block with an explanation of its purpose, but declined. Will treat with ibuprofen. Pt instructed to follow-up with dentist.  Discussed return precautions. Pt appears safe for discharge.  I personally performed the services described in this documentation, which was scribed in my presence. The recorded information has been reviewed and is accurate.  Anselm Pancoast, PA-C 08/19/15 0005  Nelva Nay, MD 08/21/15 [phone]

## 2016-06-28 ENCOUNTER — Emergency Department (HOSPITAL_COMMUNITY)
Admission: EM | Admit: 2016-06-28 | Discharge: 2016-06-29 | Disposition: A | Discharge status: Home / Self Care | Payer: Self-pay | Attending: Emergency Medicine | Admitting: Emergency Medicine

## 2016-06-28 ENCOUNTER — Encounter (HOSPITAL_COMMUNITY): Payer: Self-pay

## 2016-06-28 DIAGNOSIS — F17210 Nicotine dependence, cigarettes, uncomplicated: Secondary | ICD-10-CM | POA: Insufficient documentation

## 2016-06-28 DIAGNOSIS — L0231 Cutaneous abscess of buttock: Secondary | ICD-10-CM | POA: Insufficient documentation

## 2016-06-28 DIAGNOSIS — Z79899 Other long term (current) drug therapy: Secondary | ICD-10-CM | POA: Insufficient documentation

## 2016-06-28 NOTE — ED Triage Notes (Signed)
Pt c/o of abscess on his lower back right above his buttocks. A&Ox4. Ambulatory.

## 2016-06-29 MED ORDER — DOXYCYCLINE HYCLATE 100 MG PO CAPS: 100.0000 mg | ORAL_CAPSULE | Freq: Two times a day (BID) | ORAL | 0 refills | Status: AC

## 2016-06-29 MED ORDER — LIDOCAINE HCL (PF) 1 % IJ SOLN
5.0000 mL | Freq: Once | INTRAMUSCULAR | Status: AC
  Administered 2016-06-29: 5 mL
  Filled 2016-06-29: qty 30

## 2016-06-29 MED ORDER — OXYCODONE-ACETAMINOPHEN 5-325 MG PO TABS
1.0000 | ORAL_TABLET | Freq: Once | ORAL | Status: AC
  Administered 2016-06-29: 1 via ORAL
  Filled 2016-06-29: qty 1

## 2016-06-29 NOTE — ED Provider Notes (Signed)
[facility]-EMERGENCY DEPT Provider Note   CSN: [id number] Arrival date & time: 06/28/16  2314     History   Chief Complaint Chief Complaint  Patient presents with  . Abscess    HPI Joseph Chapman is a 26 y.o. male.  HPI  26 y.o. male presents to the Emergency Department today complaining of upper buttock abscess x 1 week. No fevers at home. Attempted warm compresses with minimal relief. Hx of same in similar area. No purulence. PT states he attempted to take Amoxicillin at home that he found that was left over. No N/V. No diarrhea. No changes in BMs. Currently rates pain 9/10. Minimal pain at rest. No other symptoms noted.   History reviewed. No pertinent past medical history.  There are no active problems to display for this patient.   History reviewed. No pertinent surgical history.     Home Medications    Prior to Admission medications   Medication Sig Start Date End Date Taking? Authorizing Provider  buprenorphine-naloxone (SUBOXONE) 8-2 MG SUBL SL tablet Place 1 tablet under the tongue 2 (two) times daily. 04/25/15   Historical Provider, MD  diphenhydrAMINE (BENADRYL) 25 mg capsule Take 25 mg by mouth every 6 (six) hours as needed for allergies.    Historical Provider, MD  EPINEPHrine 0.3 mg/0.3 mL IJ SOAJ injection Inject 0.3 mLs (0.3 mg total) into the muscle once as needed (allergic reaction). 04/29/15   Tomasita Crumble, MD  ibuprofen (ADVIL,MOTRIN) 800 MG tablet Take 1 tablet (800 mg total) by mouth 3 (three) times daily. 08/18/15   Shawn C Joy, PA-C  lidocaine (XYLOCAINE) 2 % solution Use as directed 20 mLs in the mouth or throat as needed for mouth pain. 08/18/15   Shawn C Joy, PA-C  predniSONE (DELTASONE) 10 MG tablet Take 2 tablets (20 mg total) by mouth 2 (two) times daily. 06/22/15   Geoffery Lyons, MD    Family History History reviewed. No pertinent family history.  Social History Social History  Substance Use Topics  . Smoking status: Current Every Day Smoker   Packs/day: 0.00    Types: Cigarettes  . Smokeless tobacco: Never Used  . Alcohol use No     Allergies   Zithromax [azithromycin]   Review of Systems Review of Systems  Constitutional: Negative for fever.  Gastrointestinal: Negative for nausea and vomiting.  Skin: Positive for wound.   Physical Exam Updated Vital Signs BP 121/80   Pulse 93   Temp 98.3 F (36.8 C) (Oral)   Resp 18   SpO2 98%   Physical Exam  Constitutional: He is oriented to person, place, and time. Vital signs are normal. He appears well-developed and well-nourished.  HENT:  Head: Normocephalic.  Right Ear: Hearing normal.  Left Ear: Hearing normal.  Eyes: Conjunctivae and EOM are normal. Pupils are equal, round, and reactive to light.  Neck: Normal range of motion. Neck supple.  Cardiovascular: Normal rate and regular rhythm.   Pulmonary/Chest: Effort normal.  Neurological: He is alert and oriented to person, place, and time.  Skin: Skin is warm and dry.  Upper buttock with 2cm indurated abscess. Mild erythema surrounding. No purulence.   Psychiatric: He has a normal mood and affect. His speech is normal and behavior is normal. Thought content normal.  Nursing note and vitals reviewed.  ED Treatments / Results  Labs (all labs ordered are listed, but only abnormal results are displayed) Labs Reviewed - No data to display  EKG  EKG Interpretation None  Radiology No results found.  Procedures .Marland Kitchen.Incision and Drainage Date/Time: 06/29/2016 12:22 AM Performed by: Audry PiliMOHR, Lugenia Assefa Authorized by: Audry PiliMOHR, Marianita Botkin   Consent:    Consent obtained:  Verbal   Consent given by:  Patient   Risks discussed:  Incomplete drainage and bleeding   Alternatives discussed:  No treatment Location:    Type:  Abscess   Size:  2   Location: upper buttocks. Pre-procedure details:    Skin preparation:  Betadine Anesthesia (see MAR for exact dosages):    Anesthesia method:  Local infiltration   Local  anesthetic:  Lidocaine 1% w/o epi Procedure type:    Complexity:  Simple Procedure details:    Incision types:  Single straight   Incision depth:  Dermal   Scalpel blade:  11   Wound management:  Probed and deloculated and irrigated with saline   Drainage:  Purulent and bloody   Drainage amount:  Moderate   Wound treatment:  Wound left open   Packing materials:  1/2 in gauze Post-procedure details:    Patient tolerance of procedure:  Tolerated well, no immediate complications   (including critical care time)  Medications Ordered in ED Medications  lidocaine (PF) (XYLOCAINE) 1 % injection 5 mL (not administered)  oxyCODONE-acetaminophen (PERCOCET/ROXICET) 5-325 MG per tablet 1 tablet (not administered)     Initial Impression / Assessment and Plan / ED Course  I have reviewed the triage vital signs and the nursing notes.  Pertinent labs & imaging results that were available during my care of the patient were reviewed by me and considered in my medical decision making (see chart for details).  Clinical Course    Final Clinical Impressions(s) / ED Diagnoses     {I have reviewed the relevant previous healthcare records.  {I obtained HPI from historian.   ED Course:  Assessment: Pt is a 25yM who presents to the Emergency Department with skin abscess amenable to incision and drainage. No signs of cellulitis surrounding skin. I&D in ED with purulent drainage. Given Rx Abx, Will d/c to home. Follow up to PCP or ED for wound check.   Disposition/Plan:  DC home Additional Verbal discharge instructions given and discussed with patient.  Pt Instructed to f/u with PCP in the next week for evaluation and treatment of symptoms. Return precautions given Pt acknowledges and agrees with plan  Supervising Physician Donnetta HutchingBrian Cook, MD  Final diagnoses:  Abscess of buttock    New Prescriptions New Prescriptions   DOXYCYCLINE (VIBRAMYCIN) 100 MG CAPSULE    Take 1 capsule (100 mg total) by  mouth 2 (two) times daily.     Audry Piliyler Valor [place], PA-C 06/29/16 0106    Donnetta HutchingBrian Cook, MD 06/30/16 [phone]

## 2016-06-29 NOTE — Discharge Instructions (Signed)
Please read and follow all provided instructions.  Your diagnoses today include:  1. Abscess of buttock    Tests performed today include: Vital signs. See below for your results today.   Medications prescribed:   Take any prescribed medications only as directed.   Home care instructions:  Follow any educational materials contained in this packet  Follow-up instructions: Return to the Emergency Department in 48-72 hours for a recheck if your symptoms are not significantly improved.  Please follow-up with your primary care provider in the next 1 week for further evaluation of your symptoms.   Return instructions:  Return to the Emergency Department if you have: Fever Worsening symptoms Worsening pain Worsening swelling Redness of the skin that moves away from the affected area, especially if it streaks away from the affected area  Any other emergent concerns  Additional Information: If you have recurrent abscesses, try both the following. Use a Qtip to apply an over-the-counter antibiotic to the inside of your nostrils, twice a day for 5 days. Wash your body with over-the-counter Hibaclens once a day for one week and then once every two weeks. This can reduce the amount of bacterial on your skin that causes boils and lead to fewer boils. If you continue to have multiple or recurrent boils, you should see a dermatologist (skin doctor).   Your vital signs today were: BP 121/80    Pulse 93    Temp 98.3 F (36.8 C) (Oral)    Resp 18    SpO2 98%  If your blood pressure (BP) was elevated above 135/85 this visit, please have this repeated by your doctor within one month. --------------

## 2020-11-19 ENCOUNTER — Other Ambulatory Visit: Payer: Self-pay

## 2020-11-19 ENCOUNTER — Emergency Department (HOSPITAL_COMMUNITY)
Admission: EM | Admit: 2020-11-19 | Discharge: 2020-11-20 | Disposition: A | Discharge status: Home / Self Care | Payer: Self-pay | Attending: Physician Assistant | Admitting: Physician Assistant

## 2020-11-19 ENCOUNTER — Encounter (HOSPITAL_COMMUNITY): Payer: Self-pay | Admitting: *Deleted

## 2020-11-19 DIAGNOSIS — S50362A Insect bite (nonvenomous) of left elbow, initial encounter: Secondary | ICD-10-CM | POA: Insufficient documentation

## 2020-11-19 DIAGNOSIS — W57XXXA Bitten or stung by nonvenomous insect and other nonvenomous arthropods, initial encounter: Secondary | ICD-10-CM | POA: Insufficient documentation

## 2020-11-19 DIAGNOSIS — Z5321 Procedure and treatment not carried out due to patient leaving prior to being seen by health care provider: Secondary | ICD-10-CM | POA: Insufficient documentation

## 2020-11-19 NOTE — ED Triage Notes (Signed)
The pt has an abscess on his lt elbow  Redness and swelling he thinks he has been biten by a spider

## 2020-11-19 NOTE — ED Provider Notes (Signed)
Emergency Medicine Provider Triage Evaluation Note  Joseph Chapman , a 30 y.o. male  was evaluated in triage.  Pt complains of possible spider bite just below his left elbow.  Patient states that he lives in a home with a lot of spiders and has had spider bites in the past.  He states that the wound has been continuing to get bigger.  He denies any fevers or chills.  He is still able to bend the elbow.  Review of Systems  Positive: As above Negative: As above  Physical Exam  Ht 5' (1.524 m)   Wt 71.6 kg   BMI 30.83 kg/m  Gen:   Awake, no distress   Resp:  Normal effort  MSK:   Moves extremities without difficulty  Other:  Large wound just inferior to the left elbow.  Full flexion and extension of the left elbow.  Medical Decision Making  Medically screening exam initiated at 11:19 PM.  Appropriate orders placed.  Asencion Partridge was informed that the remainder of the evaluation will be completed by another provider, this initial triage assessment does not replace that evaluation, and the importance of remaining in the ED until their evaluation is complete.     Mare Ferrari, PA-C 11/19/20 2322    Gwyneth Sprout, MD 11/20/20 [phone]

## 2020-11-20 ENCOUNTER — Observation Stay (HOSPITAL_COMMUNITY)
Admission: EM | Admit: 2020-11-20 | Discharge: 2020-11-21 | Disposition: A | Discharge status: Home / Self Care | Payer: Self-pay | Attending: Internal Medicine | Admitting: Internal Medicine

## 2020-11-20 ENCOUNTER — Encounter (HOSPITAL_COMMUNITY): Payer: Self-pay | Admitting: Emergency Medicine

## 2020-11-20 DIAGNOSIS — Z20822 Contact with and (suspected) exposure to covid-19: Secondary | ICD-10-CM | POA: Insufficient documentation

## 2020-11-20 DIAGNOSIS — L03114 Cellulitis of left upper limb: Secondary | ICD-10-CM | POA: Insufficient documentation

## 2020-11-20 DIAGNOSIS — L02414 Cutaneous abscess of left upper limb: Principal | ICD-10-CM | POA: Insufficient documentation

## 2020-11-20 DIAGNOSIS — F17210 Nicotine dependence, cigarettes, uncomplicated: Secondary | ICD-10-CM | POA: Insufficient documentation

## 2020-11-20 LAB — CBC WITH DIFFERENTIAL/PLATELET
Abs Immature Granulocytes: 0.1 10*3/uL — ABNORMAL HIGH (ref 0.00–0.07)
Basophils Absolute: 0.1 10*3/uL (ref 0.0–0.1)
Basophils Relative: 0 %
Eosinophils Absolute: 0.1 10*3/uL (ref 0.0–0.5)
Eosinophils Relative: 0 %
HCT: 43.9 % (ref 39.0–52.0)
Hemoglobin: 15.5 g/dL (ref 13.0–17.0)
Immature Granulocytes: 1 %
Lymphocytes Relative: 16 %
Lymphs Abs: 3 10*3/uL (ref 0.7–4.0)
MCH: 30.9 pg (ref 26.0–34.0)
MCHC: 35.3 g/dL (ref 30.0–36.0)
MCV: 87.5 fL (ref 80.0–100.0)
Monocytes Absolute: 1.7 10*3/uL — ABNORMAL HIGH (ref 0.1–1.0)
Monocytes Relative: 9 %
Neutro Abs: 14.1 10*3/uL — ABNORMAL HIGH (ref 1.7–7.7)
Neutrophils Relative %: 74 %
Platelets: 298 10*3/uL (ref 150–400)
RBC: 5.02 MIL/uL (ref 4.22–5.81)
RDW: 12.2 % (ref 11.5–15.5)
WBC: 19 10*3/uL — ABNORMAL HIGH (ref 4.0–10.5)
nRBC: 0 % (ref 0.0–0.2)

## 2020-11-20 LAB — BASIC METABOLIC PANEL
Anion gap: 8 (ref 5–15)
BUN: 8 mg/dL (ref 6–20)
CO2: 26 mmol/L (ref 22–32)
Calcium: 8.9 mg/dL (ref 8.9–10.3)
Chloride: 97 mmol/L — ABNORMAL LOW (ref 98–111)
Creatinine, Ser: 0.86 mg/dL (ref 0.61–1.24)
GFR, Estimated: >60 mL/min (ref >60)
Glucose, Bld: 97 mg/dL (ref 70–99)
Potassium: 3.4 mmol/L — ABNORMAL LOW (ref 3.5–5.1)
Sodium: 131 mmol/L — ABNORMAL LOW (ref 135–145)

## 2020-11-20 LAB — LACTIC ACID, PLASMA: Lactic Acid, Venous: 0.8 mmol/L (ref 0.5–1.9)

## 2020-11-20 NOTE — ED Notes (Signed)
Pt called for VS multiple times, no response. Pt not seen in the waiting room.

## 2020-11-20 NOTE — ED Triage Notes (Signed)
Pt presents with an abscess to his L elbow. Some drainage noted. Redness and swelling noted as well. States that he thinks he was bitten by something. He saw a "large ant" on his bed, but states that he cannot identify that something definitely bit him. No fever, but area is read and hot. He had blood work done at [facility] last night, but states that he left before being seen because they told him it would be 10 hours.

## 2020-11-21 ENCOUNTER — Emergency Department (HOSPITAL_COMMUNITY): Payer: Self-pay

## 2020-11-21 DIAGNOSIS — L03114 Cellulitis of left upper limb: Secondary | ICD-10-CM

## 2020-11-21 LAB — CBC WITH DIFFERENTIAL/PLATELET
Abs Immature Granulocytes: 0.04 10*3/uL (ref 0.00–0.07)
Basophils Absolute: 0.1 10*3/uL (ref 0.0–0.1)
Basophils Relative: 0 %
Eosinophils Absolute: 0.2 10*3/uL (ref 0.0–0.5)
Eosinophils Relative: 2 %
HCT: 43.9 % (ref 39.0–52.0)
Hemoglobin: 15.2 g/dL (ref 13.0–17.0)
Immature Granulocytes: 0 %
Lymphocytes Relative: 21 %
Lymphs Abs: 2.9 10*3/uL (ref 0.7–4.0)
MCH: 30.4 pg (ref 26.0–34.0)
MCHC: 34.6 g/dL (ref 30.0–36.0)
MCV: 87.8 fL (ref 80.0–100.0)
Monocytes Absolute: 1 10*3/uL (ref 0.1–1.0)
Monocytes Relative: 8 %
Neutro Abs: 9.1 10*3/uL — ABNORMAL HIGH (ref 1.7–7.7)
Neutrophils Relative %: 69 %
Platelets: 293 10*3/uL (ref 150–400)
RBC: 5 MIL/uL (ref 4.22–5.81)
RDW: 12.4 % (ref 11.5–15.5)
WBC: 13.3 10*3/uL — ABNORMAL HIGH (ref 4.0–10.5)
nRBC: 0 % (ref 0.0–0.2)

## 2020-11-21 LAB — RESP PANEL BY RT-PCR (FLU A&B, COVID) ARPGX2
Influenza A by PCR: NEGATIVE
Influenza B by PCR: NEGATIVE
SARS Coronavirus 2 by RT PCR: NEGATIVE

## 2020-11-21 LAB — BASIC METABOLIC PANEL
Anion gap: 9 (ref 5–15)
BUN: 13 mg/dL (ref 6–20)
CO2: 28 mmol/L (ref 22–32)
Calcium: 9.2 mg/dL (ref 8.9–10.3)
Chloride: 100 mmol/L (ref 98–111)
Creatinine, Ser: 0.8 mg/dL (ref 0.61–1.24)
GFR, Estimated: >60 mL/min (ref >60)
Glucose, Bld: 109 mg/dL — ABNORMAL HIGH (ref 70–99)
Potassium: 3.6 mmol/L (ref 3.5–5.1)
Sodium: 137 mmol/L (ref 135–145)

## 2020-11-21 LAB — LACTIC ACID, PLASMA: Lactic Acid, Venous: 1 mmol/L (ref 0.5–1.9)

## 2020-11-21 MED ORDER — IBUPROFEN 800 MG PO TABS: 800.0000 mg | ORAL_TABLET | Freq: Three times a day (TID) | ORAL | Status: DC

## 2020-11-21 MED ORDER — VANCOMYCIN HCL IN DEXTROSE 1-5 GM/200ML-% IV SOLN
1000.0000 mg | Freq: Once | INTRAVENOUS | Status: AC
  Administered 2020-11-21: 1000 mg via INTRAVENOUS
  Filled 2020-11-21: qty 200

## 2020-11-21 MED ORDER — SODIUM CHLORIDE 0.9% FLUSH
3.0000 mL | Freq: Two times a day (BID) | INTRAVENOUS | Status: DC
  Administered 2020-11-21: 3 mL via INTRAVENOUS

## 2020-11-21 MED ORDER — LIDOCAINE-EPINEPHRINE 2 %-1:100000 IJ SOLN
30.0000 mL | Freq: Once | INTRAMUSCULAR | Status: AC
  Administered 2020-11-21: 30 mL
  Filled 2020-11-21: qty 2

## 2020-11-21 MED ORDER — ENOXAPARIN SODIUM 40 MG/0.4ML IJ SOSY: 40.0000 mg | PREFILLED_SYRINGE | INTRAMUSCULAR | Status: DC

## 2020-11-21 MED ORDER — FENTANYL CITRATE (PF) 100 MCG/2ML IJ SOLN
75.0000 ug | Freq: Once | INTRAMUSCULAR | Status: AC
  Administered 2020-11-21: 75 ug via INTRAVENOUS
  Filled 2020-11-21: qty 2

## 2020-11-21 MED ORDER — ACETAMINOPHEN 325 MG PO TABS: 650.0000 mg | ORAL_TABLET | Freq: Four times a day (QID) | ORAL | Status: DC | PRN

## 2020-11-21 MED ORDER — VANCOMYCIN HCL IN DEXTROSE 1-5 GM/200ML-% IV SOLN: 1000.0000 mg | Freq: Once | INTRAVENOUS | Status: DC

## 2020-11-21 MED ORDER — DOXYCYCLINE HYCLATE 100 MG PO CAPS: 100.0000 mg | ORAL_CAPSULE | Freq: Two times a day (BID) | ORAL | 0 refills | Status: AC

## 2020-11-21 MED ORDER — BUPRENORPHINE HCL-NALOXONE HCL 8-2 MG SL SUBL
1.0000 | SUBLINGUAL_TABLET | Freq: Two times a day (BID) | SUBLINGUAL | Status: DC
  Administered 2020-11-21: 1 via SUBLINGUAL
  Filled 2020-11-21: qty 1

## 2020-11-21 MED ORDER — VANCOMYCIN HCL 750 MG/150ML IV SOLN: 750.0000 mg | Freq: Two times a day (BID) | INTRAVENOUS | Status: DC

## 2020-11-21 MED ORDER — ACETAMINOPHEN 650 MG RE SUPP: 650.0000 mg | Freq: Four times a day (QID) | RECTAL | Status: DC | PRN

## 2020-11-21 MED ORDER — SODIUM CHLORIDE 0.9 % IV SOLN: 250.0000 mL | INTRAVENOUS | Status: DC | PRN

## 2020-11-21 MED ORDER — SODIUM CHLORIDE 0.9 % IV SOLN
2.0000 g | Freq: Three times a day (TID) | INTRAVENOUS | Status: DC
  Administered 2020-11-21: 2 g via INTRAVENOUS
  Filled 2020-11-21: qty 2

## 2020-11-21 MED ORDER — SODIUM CHLORIDE 0.9% FLUSH: 3.0000 mL | INTRAVENOUS | Status: DC | PRN

## 2020-11-21 MED ORDER — KETOROLAC TROMETHAMINE 30 MG/ML IJ SOLN: 30.0000 mg | Freq: Four times a day (QID) | INTRAMUSCULAR | Status: DC | PRN

## 2020-11-21 NOTE — Discharge Instructions (Addendum)
Thank you for allowing me to care for you today in the Emergency Department.   Take 1 tablet of doxycycline 2 times daily for the next 7 days.  Clean the wound daily with warm water and soap.  Pat the area dry then cover the area with a bandage.  Repeat this daily.  If the bandage sticks to your skin, you can try running it under water and gently removing it from the skin.  Take 650 mg of Tylenol or 600 mg of ibuprofen with food every 6 hours for pain.  You can alternate between these 2 medications every 3 hours if your pain returns.  For instance, you can take Tylenol at noon, followed by a dose of ibuprofen at 3, followed by second dose of Tylenol and 6.  I placed a call for our transition of care team.  They should likely give you a call tomorrow on your cell phone to help you with assistance with getting primary care for a wound recheck.  Do not try to cut or open the wound at home with any other objects.  Return to the emergency department if you develop significantly worsening redness, swelling, or drainage, especially after you have been on antibiotics for a few days, new numbness or weakness in the next few days, if you start having fevers or chills, especially after you have been on antibiotics for a few days, or other new, concerning symptoms.

## 2020-11-21 NOTE — Consult Note (Signed)
Medical Consultation   Joseph Chapman  YYT:[id number]  DOB: May 10, 1991  DOA: 11/20/2020  PCP: Patient, No Pcp Per (Inactive)   Outpatient Specialists: none    Requesting physician: Frederik Pear, PA-C  Reason for consultation: Evaluate patient with cellulitis left arm   History of Present Illness: Joseph Chapman is an 30 y.o. male   medical history significant of suboxone treatment. He sustained a skin injury approximately 3 days PTA. He has had progressive pain, erythema, calore and dolore. He came to ED 11/19/20 where he did have a CBC revealing a marked leukocytosis to 19k. He left without being seen. He returns 11/21/20 for increasing redness extending the length of his left arm from elbow to hand with swelling and pain.    ED Course: T 98.1  126/82  HR 99 RR 15. Per ED provider there was nl ROM left elbow. Xray revealed well preserved joint space, soft tissue swelling. Lab reveals leukocytosis to 13.3 w/ 69/2/8, Bmet nl, lactic acid 1.0. ED provider did perform I&D/aspirate productive or serosanquinous fluid- specimen sent for culture. Blood cultures also sent. Patient was administered Vancomycin and cefepime IV. TRH called to consult on continued treatment.         Review of Systems:  ROS As per HPI otherwise 10 point review of systems negative.     Past Medical History: History reviewed. No pertinent past medical history.  Past Surgical History: History reviewed. No pertinent surgical history.   Allergies:   Allergies  Allergen Reactions  . Zithromax [Azithromycin] Hives   Soc Hx - single male. Works as a Nutritional therapist.   Social History:  reports that he has been smoking cigarettes. He has been smoking about 0.00 packs per day. He has never used smokeless tobacco. He reports current drug use. Drug: Marijuana. He reports that he does not drink alcohol. Has a h/o oxycodone addiction now resolved..   Family History: History reviewed. No  pertinent family history.     Physical Exam: Vitals:   11/21/20 0121 11/21/20 0130 11/21/20 0215 11/21/20 0325  BP:  129/87 (!) 142/82 126/82  Pulse:  91 (!) 105 99  Resp:  10 14 15   Temp:      TempSrc:      SpO2:  98% 100% 99%  Height: 5\' 5"  (1.651 m)       Constitutional: Appearance-  Alert and awake, oriented x3, not in any acute distress. Eyes: PERLA, EOMI, irises appear normal, anicteric sclera,  ENMT:normal hearing             Lips appears normal, oropharynx mucosa, tongue, posterior pharynx appear normal  Neck: neck appears normal, no masses, normal ROM, no thyromegaly, no JVD  CVS: S1-S2 clear, no murmur rubs or gallops, no LE edema, normal pedal pulses  Respiratory:  clear to auscultation bilaterally, no wheezing, rales or rhonchi. Respiratory effort normal. No accessory muscle use.  Abdomen: soft nontender, nondistended, normal bowel sounds, no hepatosplenomegaly, no hernias  Musculoskeletal: : no cyanosis, clubbing or edema noted bilaterally. Full ROM left elbow, left hand Neuro: Cranial nerves II-XII intact, strength, sensation, reflexes Psych: judgement and insight appear normal, stable mood and affect, mental status Skin: tatoos up both arms. Dry dressing in place about left elbow. Mild erythema from elbow to dorsum left hand with mild swelling.     Data reviewed:  I have personally reviewed following labs and imaging studies Labs:  CBC: Recent  Labs  Lab 11/19/20 2320 11/21/20 0030  WBC 19.0* 13.3*  NEUTROABS 14.1* 9.1*  HGB 15.5 15.2  HCT 43.9 43.9  MCV 87.5 87.8  PLT 298 293    Basic Metabolic Panel: Recent Labs  Lab 11/19/20 2320 11/21/20 0030  NA 131* 137  K 3.4* 3.6  CL 97* 100  CO2 26 28  GLUCOSE 97 109*  BUN 8 13  CREATININE 0.86 0.80  CALCIUM 8.9 9.2   GFR Estimated Creatinine Clearance: 117.4 mL/min (by C-G formula based on SCr of 0.8 mg/dL). Liver Function Tests: No results for input(s): AST, ALT, ALKPHOS, BILITOT, PROT, ALBUMIN  in the last 168 hours. No results for input(s): LIPASE, AMYLASE in the last 168 hours. No results for input(s): AMMONIA in the last 168 hours. Coagulation profile No results for input(s): INR, PROTIME in the last 168 hours.  Cardiac Enzymes: No results for input(s): CKTOTAL, CKMB, CKMBINDEX, TROPONINI in the last 168 hours. BNP: Invalid input(s): POCBNP CBG: No results for input(s): GLUCAP in the last 168 hours. D-Dimer No results for input(s): DDIMER in the last 72 hours. Hgb A1c No results for input(s): HGBA1C in the last 72 hours. Lipid Profile No results for input(s): CHOL, HDL, LDLCALC, TRIG, CHOLHDL, LDLDIRECT in the last 72 hours. Thyroid function studies No results for input(s): TSH, T4TOTAL, T3FREE, THYROIDAB in the last 72 hours.  Invalid input(s): FREET3 Anemia work up No results for input(s): VITAMINB12, FOLATE, FERRITIN, TIBC, IRON, RETICCTPCT in the last 72 hours. Urinalysis No results found for: COLORURINE, APPEARANCEUR, LABSPEC, PHURINE, GLUCOSEU, HGBUR, BILIRUBINUR, KETONESUR, PROTEINUR, UROBILINOGEN, NITRITE, LEUKOCYTESUR   Microbiology Recent Results (from the past 240 hour(s))  Resp Panel by RT-PCR (Flu A&B, Covid) Nasopharyngeal Swab     Status: None   Collection Time: 11/21/20 12:31 AM   Specimen: Nasopharyngeal Swab; Nasopharyngeal(NP) swabs in vial transport medium  Result Value Ref Range Status   SARS Coronavirus 2 by RT PCR NEGATIVE NEGATIVE Final    Comment: (NOTE) SARS-CoV-2 target nucleic acids are NOT DETECTED.  The SARS-CoV-2 RNA is generally detectable in upper respiratory specimens during the acute phase of infection. The lowest concentration of SARS-CoV-2 viral copies this assay can detect is 138 copies/mL. A negative result does not preclude SARS-Cov-2 infection and should not be used as the sole basis for treatment or other patient management decisions. A negative result may occur with  improper specimen collection/handling, submission  of specimen other than nasopharyngeal swab, presence of viral mutation(s) within the areas targeted by this assay, and inadequate number of viral copies(<138 copies/mL). A negative result must be combined with clinical observations, patient history, and epidemiological information. The expected result is Negative.  Fact Sheet for Patients:  BloggerCourse.com  Fact Sheet for Healthcare Providers:  SeriousBroker.it  This test is no t yet approved or cleared by the [place] FDA and  has been authorized for detection and/or diagnosis of SARS-CoV-2 by FDA under an Emergency Use Authorization (EUA). This EUA will remain  in effect (meaning this test can be used) for the duration of the COVID-19 declaration under Section 564(b)(1) of the Act, 21 U.S.C.section 360bbb-3(b)(1), unless the authorization is terminated  or revoked sooner.       Influenza A by PCR NEGATIVE NEGATIVE Final   Influenza B by PCR NEGATIVE NEGATIVE Final    Comment: (NOTE) The Xpert Xpress SARS-CoV-2/FLU/RSV plus assay is intended as an aid in the diagnosis of influenza from Nasopharyngeal swab specimens and should not be used as a sole basis for  treatment. Nasal washings and aspirates are unacceptable for Xpert Xpress SARS-CoV-2/FLU/RSV testing.  Fact Sheet for Patients: BloggerCourse.com  Fact Sheet for Healthcare Providers: SeriousBroker.it  This test is not yet approved or cleared by the [place] FDA and has been authorized for detection and/or diagnosis of SARS-CoV-2 by FDA under an Emergency Use Authorization (EUA). This EUA will remain in effect (meaning this test can be used) for the duration of the COVID-19 declaration under Section 564(b)(1) of the Act, 21 U.S.C. section 360bbb-3(b)(1), unless the authorization is terminated or revoked.  Performed at [facility], [address], [place], [place] [address]        Inpatient Medications:   Scheduled Meds: . buprenorphine-naloxone  1 tablet Sublingual BID  . enoxaparin (LOVENOX) injection  40 mg Subcutaneous Q24H  . ibuprofen  800 mg Oral TID  . sodium chloride flush  3 mL Intravenous Q12H   Continuous Infusions: . sodium chloride    . ceFEPime (MAXIPIME) IV Stopped (11/21/20 0220)  . vancomycin       Radiological Exams on Admission: DG Elbow Complete Left  Result Date: 11/21/2020 CLINICAL DATA:  Left elbow pain, erythema and swelling over olecranon EXAM: LEFT ELBOW - COMPLETE 3+ VIEW COMPARISON:  None. FINDINGS: Frontal, bilateral oblique, lateral views of the left elbow are obtained. No fracture, subluxation, or dislocation. Joint spaces are well preserved. No joint effusion. There is diffuse soft tissue swelling, greatest dorsally. No radiopaque foreign bodies or subcutaneous gas. IMPRESSION: 1. Significant dorsal soft tissue swelling. No acute bony abnormality. Electronically Signed   By: Sharlet Salina M.D.   On: 11/21/2020 00:46    Impression/Recommendations Active Problems:   Cellulitis of left arm  1. Cellulitis left arm - patient with no fever, decreasing leukocytosis prior to receiving abx. Has had I&D with serous fluid released followed by reduction in swelling and pain.   Recommend: Outpatient treatment with doxycycline 100 mg bid x 7 days Cleanse wound with soap and water bid, cover with bandaide - large format Follow up [facility] - TOC consult requested Watch for red flag symptoms: fever, chills, weakness, increasing swelling, increasing redness. For any symptoms return to ED   Thank you for this consultation.     Time Spent: 45 min  Illene Regulus M.D. [facility] 11/21/2020, 4:08 AM
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
## 2020-11-21 NOTE — ED Notes (Signed)
Patient left elbow cleaned and wrapped with gauze.

## 2020-11-22 LAB — CULTURE, BLOOD (ROUTINE X 2): Culture: NO GROWTH

## 2020-11-25 LAB — CULTURE, BLOOD (ROUTINE X 2): Special Requests: ADEQUATE

## 2020-11-26 LAB — CULTURE, BLOOD (ROUTINE X 2)
Culture: NO GROWTH
Special Requests: ADEQUATE

## 2022-09-28 IMAGING — CR DG ELBOW COMPLETE 3+V*L*
4 series · 4 of 4 positions shown · non-contrast
Comparison: None.

CLINICAL DATA: Left elbow pain, erythema and swelling over
olecranon

EXAM:
LEFT ELBOW - COMPLETE 3+ VIEW

[x elbow ap left]
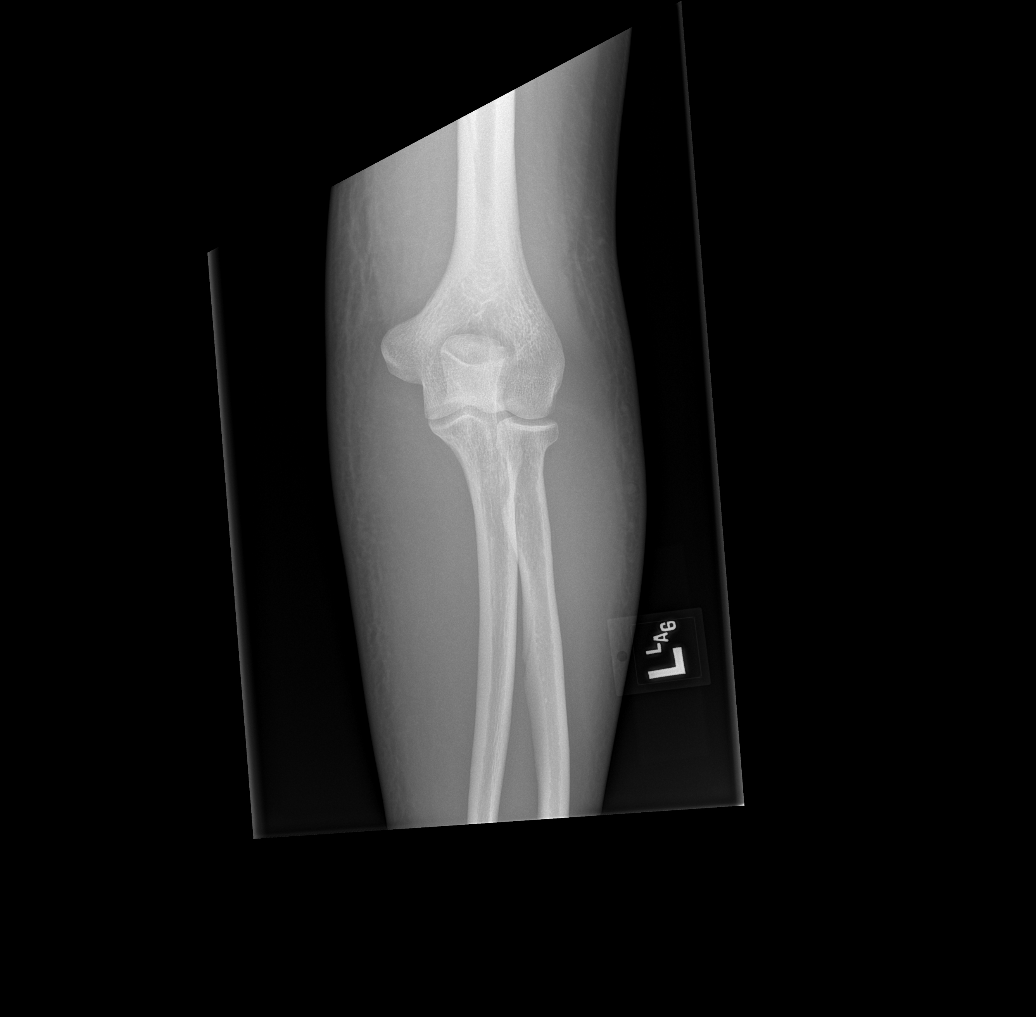

[x elbow obl left (1 of 2)]
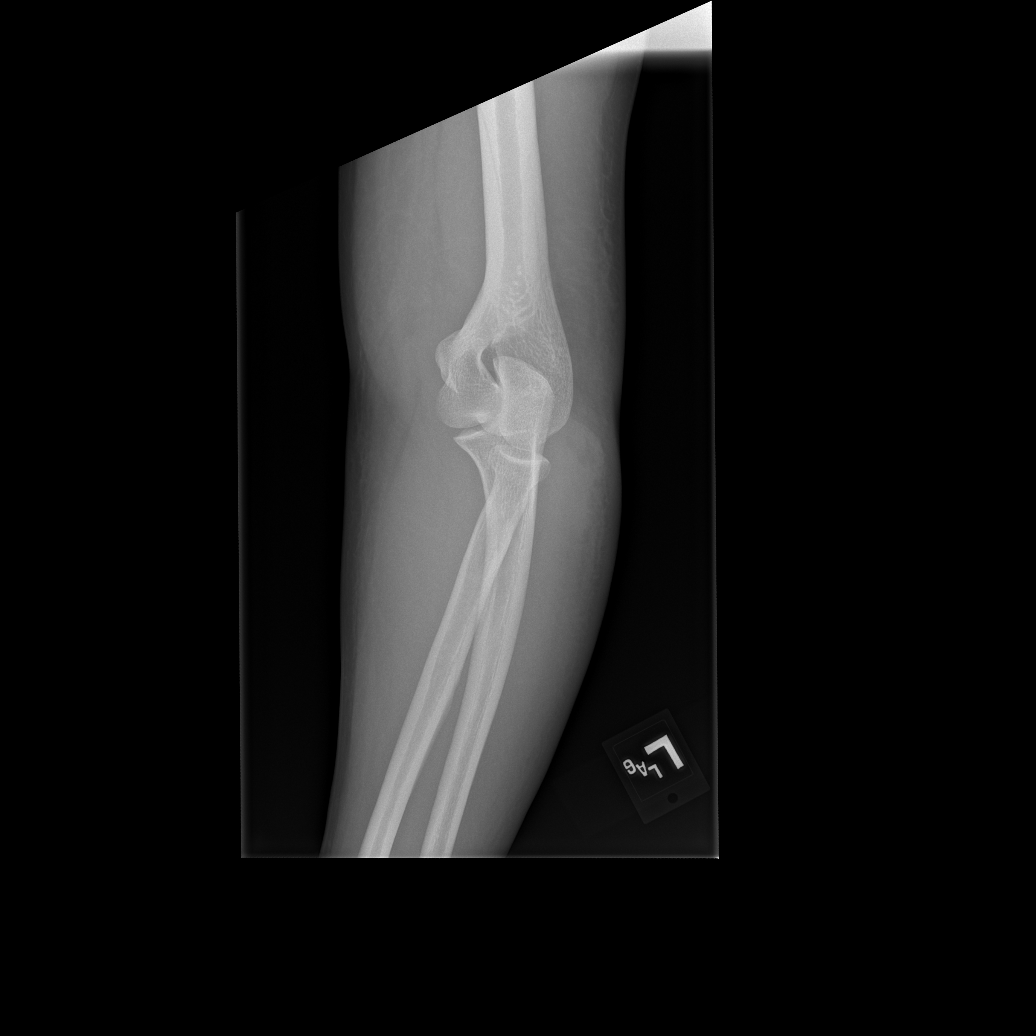

[x elbow obl left (2 of 2)]
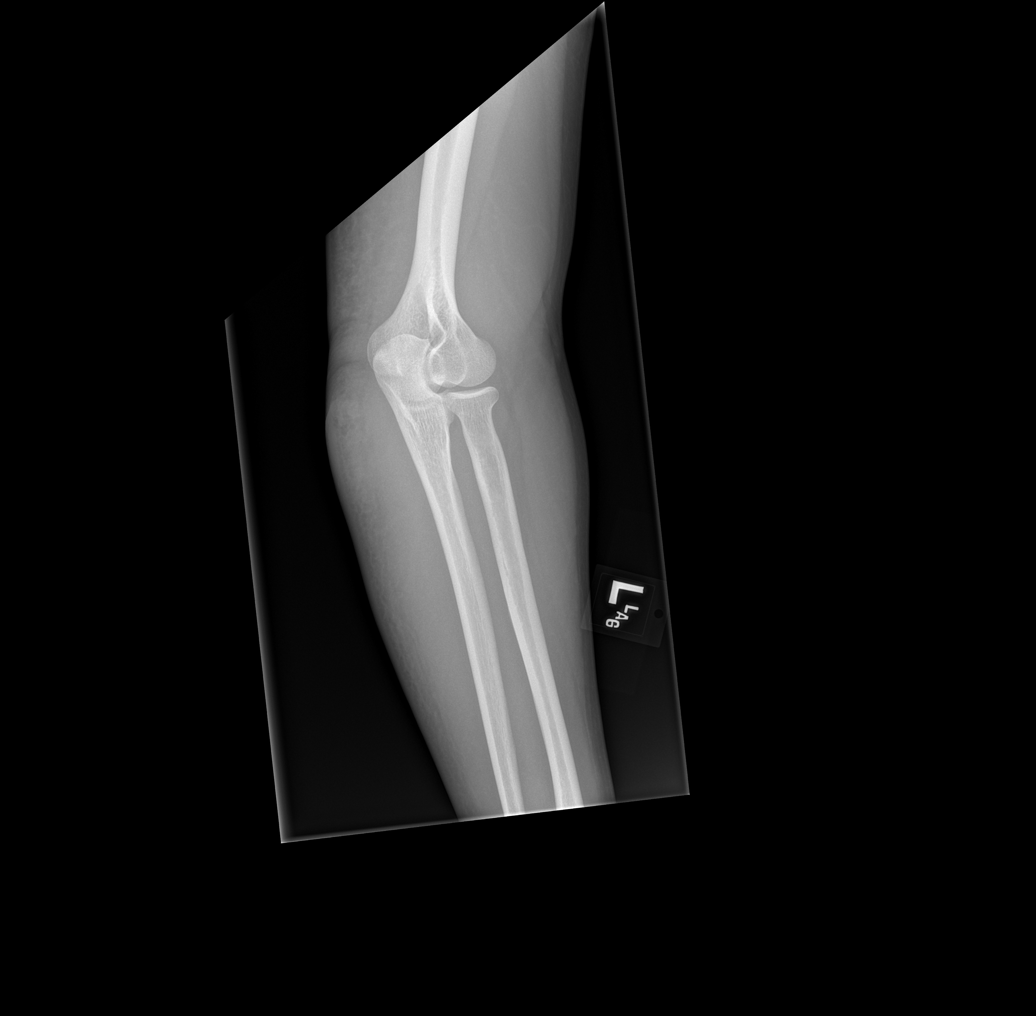

[x elbow lat left]
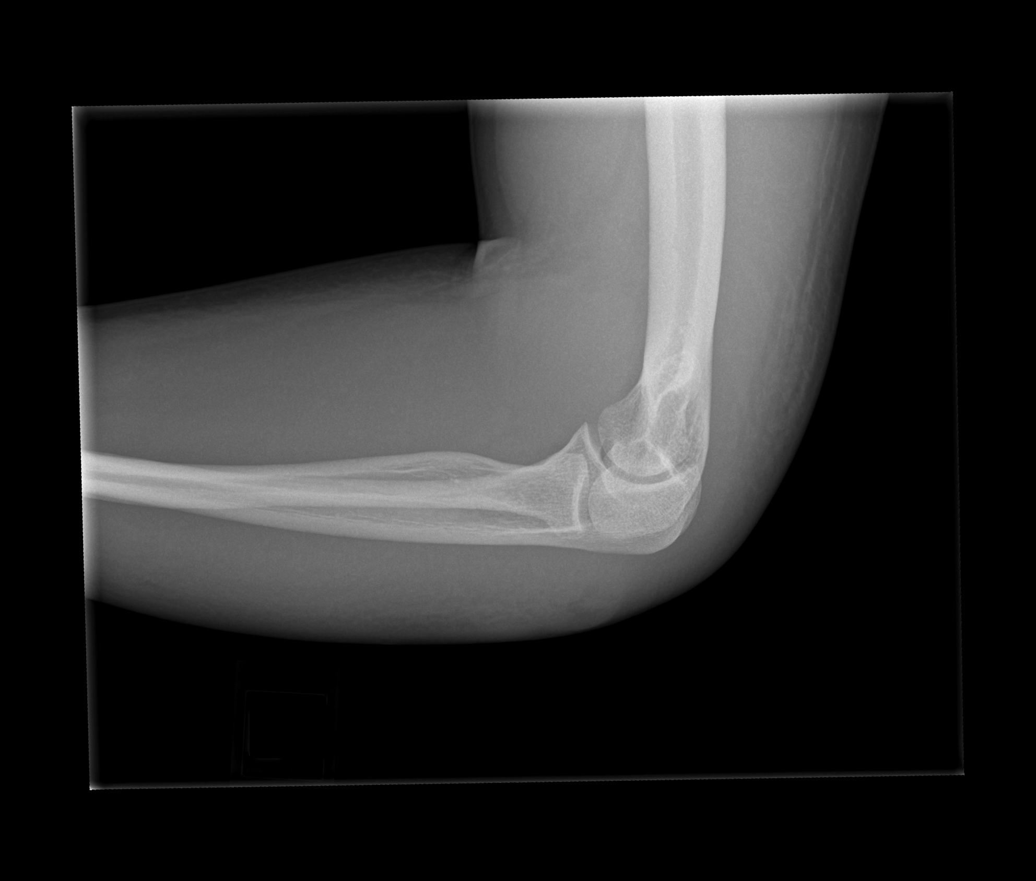

[4 of 4 positions shown; findings below may reference images not displayed]

FINDINGS: Frontal, bilateral oblique, lateral views of the left elbow are
obtained. No fracture, subluxation, or dislocation. Joint spaces are
well preserved. No joint effusion. There is diffuse soft tissue
swelling, greatest dorsally. No radiopaque foreign bodies or
subcutaneous gas.
IMPRESSION: 1. Significant dorsal soft tissue swelling. No acute bony
abnormality.
